# Patient Record
Sex: Female | Born: 1980 | Hispanic: Yes | Marital: Single | Smoking: Never smoker
Health system: Southern US, Community
[De-identification: ages and names within clinical notes are randomized; demographics above are authoritative.]

## PROBLEM LIST (undated history)

## (undated) ENCOUNTER — Inpatient Hospital Stay (HOSPITAL_COMMUNITY): Payer: Self-pay

## (undated) DIAGNOSIS — O09299 Supervision of pregnancy with other poor reproductive or obstetric history, unspecified trimester: Secondary | ICD-10-CM

## (undated) DIAGNOSIS — Z789 Other specified health status: Secondary | ICD-10-CM

## (undated) HISTORY — DX: Supervision of pregnancy with other poor reproductive or obstetric history, unspecified trimester: O09.299

## (undated) HISTORY — PX: NO PAST SURGERIES: SHX2092

## (undated) HISTORY — DX: Other specified health status: Z78.9

---
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
## 2000-01-01 ENCOUNTER — Other Ambulatory Visit: Admission: RE | Admit: 2000-01-01 | Discharge: 2000-01-01 | Payer: Self-pay | Admitting: Obstetrics

## 2000-01-01 ENCOUNTER — Encounter (INDEPENDENT_AMBULATORY_CARE_PROVIDER_SITE_OTHER): Payer: Self-pay

## 2002-03-12 ENCOUNTER — Ambulatory Visit (HOSPITAL_COMMUNITY): Admission: AD | Admit: 2002-03-12 | Discharge: 2002-03-12 | Payer: Self-pay | Admitting: *Deleted

## 2002-07-31 ENCOUNTER — Encounter: Admission: RE | Admit: 2002-07-31 | Discharge: 2002-07-31 | Payer: Self-pay | Admitting: *Deleted

## 2002-08-05 ENCOUNTER — Inpatient Hospital Stay (HOSPITAL_COMMUNITY): Admission: AD | Admit: 2002-08-05 | Discharge: 2002-08-07 | Payer: Self-pay | Admitting: *Deleted

## 2004-10-12 ENCOUNTER — Emergency Department (HOSPITAL_COMMUNITY): Admission: EM | Admit: 2004-10-12 | Discharge: 2004-10-12 | Payer: Self-pay | Admitting: Emergency Medicine

## 2004-12-19 ENCOUNTER — Ambulatory Visit (HOSPITAL_COMMUNITY): Admission: RE | Admit: 2004-12-19 | Discharge: 2004-12-19 | Payer: Self-pay | Admitting: *Deleted

## 2004-12-19 IMAGING — US US OB COMP +14 WK
1 series · 13 of 28 positions shown · non-contrast
Comparison: none

CLINICAL DATA: Anatomy scan; estimated gestational age by LMP is 17 weeks 6 days.

[Series 1: us ob comp +14 wk · 0.29mm/px · 13 of 64 slices shown]
[im 3/64]
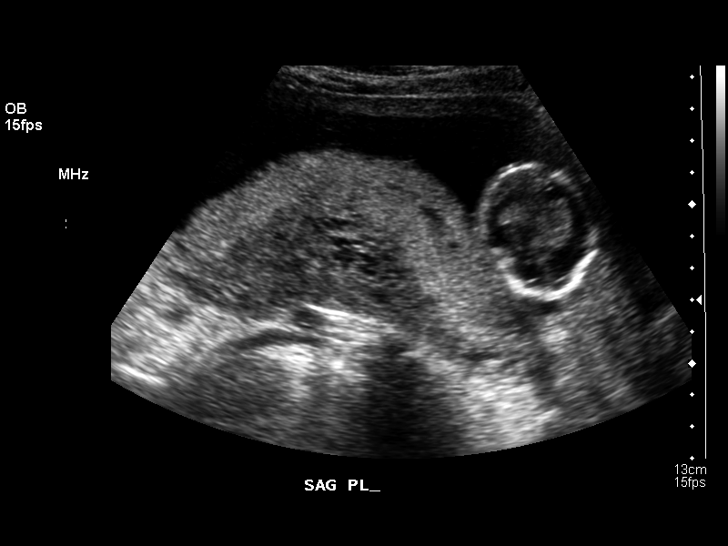
[im 8/64]
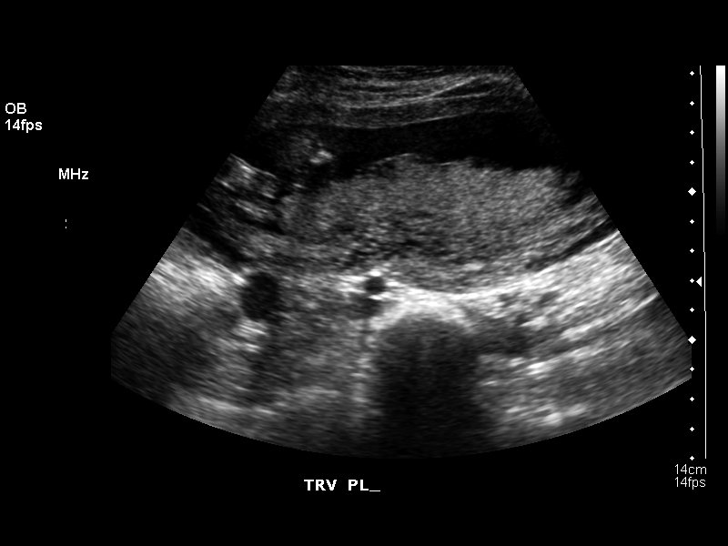
[im 12/64]
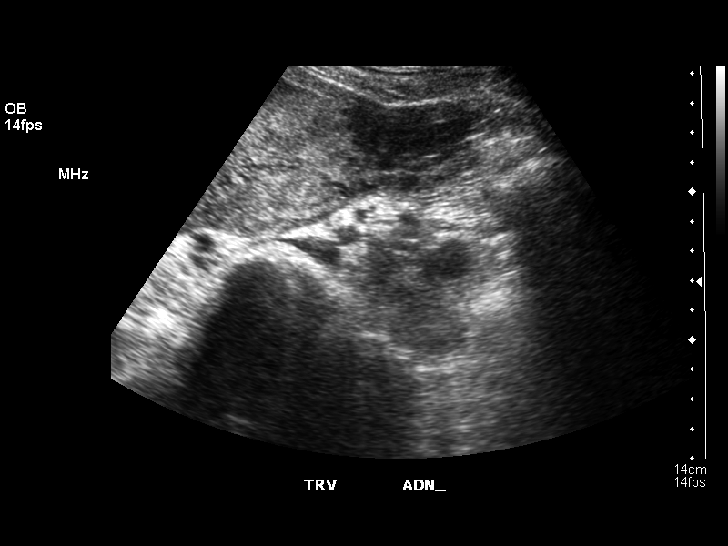
[im 17/64]
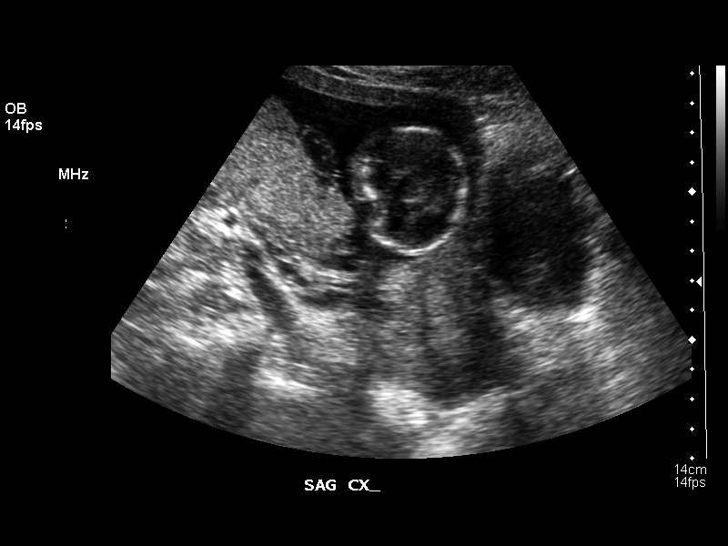
[im 22/64]
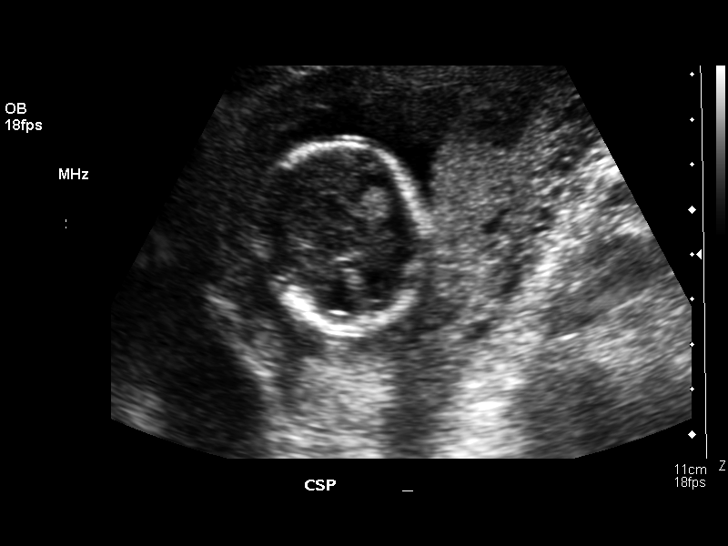
[im 26/64]
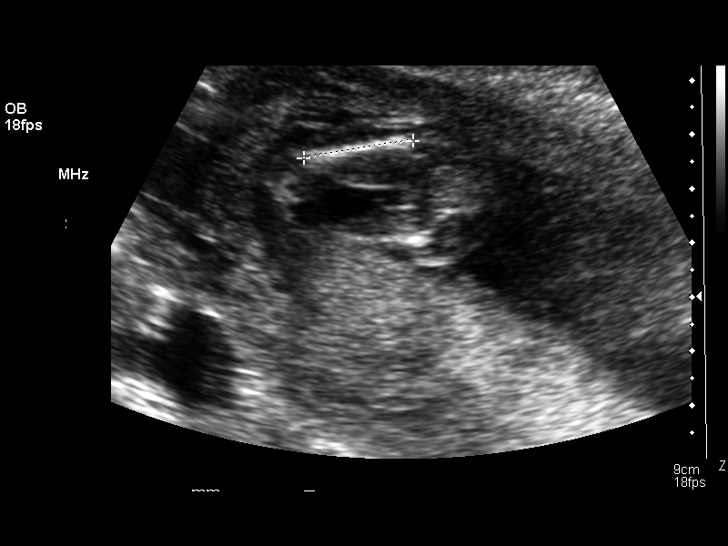
[im 33/64]
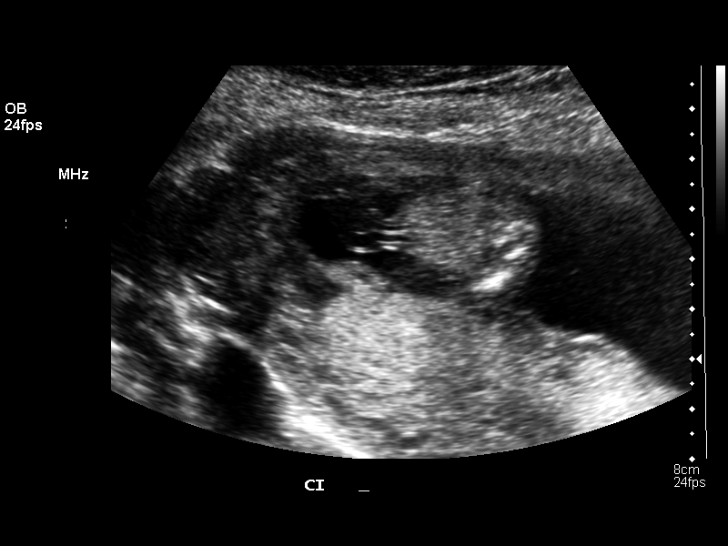
[im 38/64]
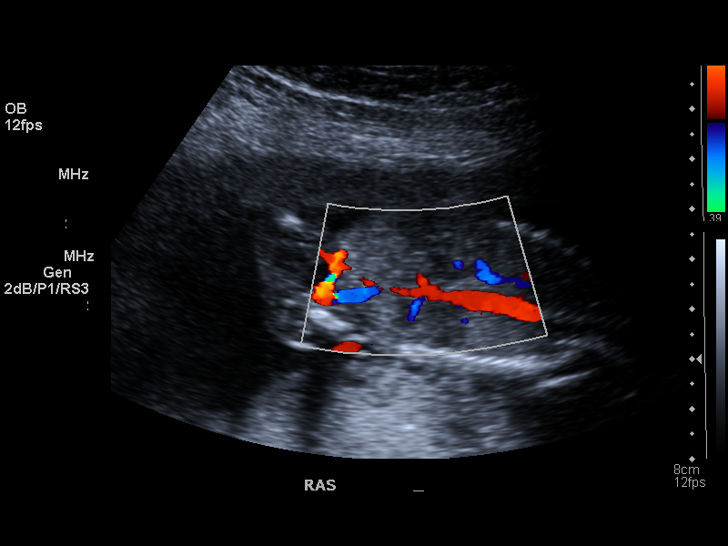
[im 43/64]
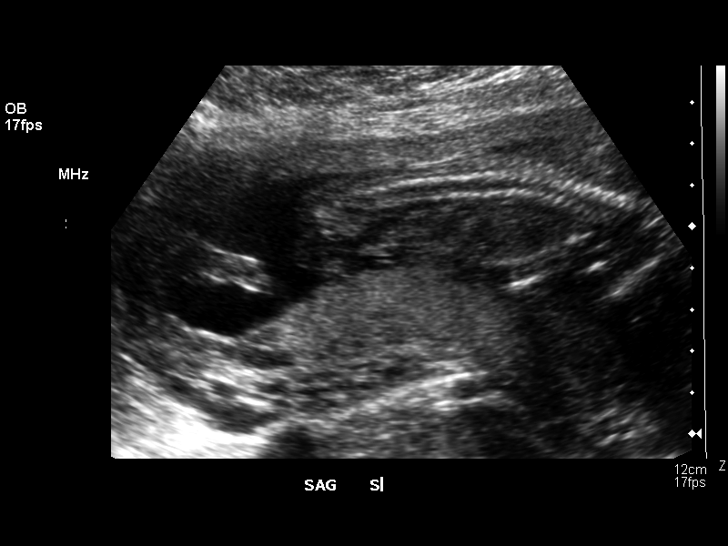
[im 47/64]
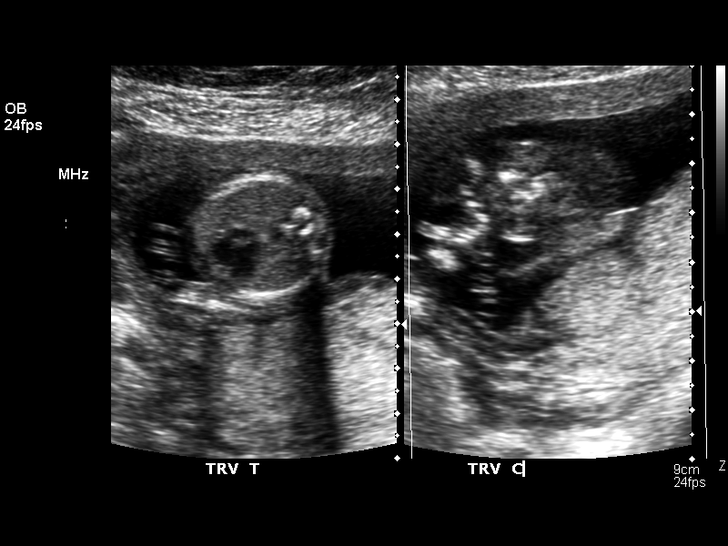
[im 52/64]
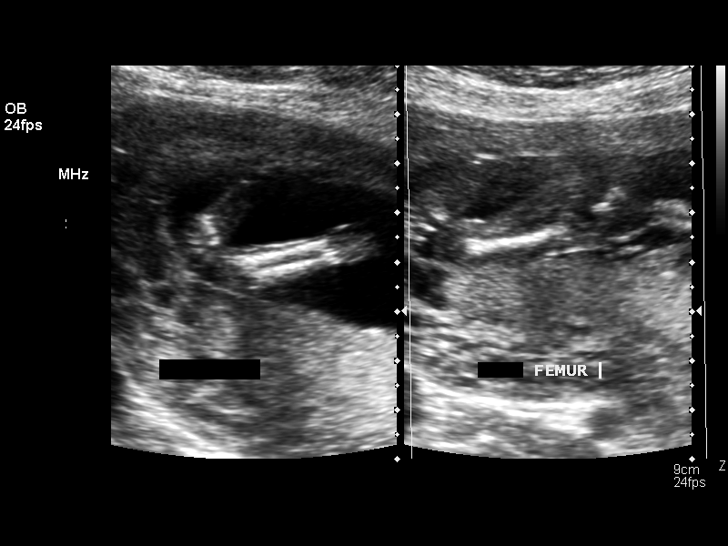
[im 57/64]
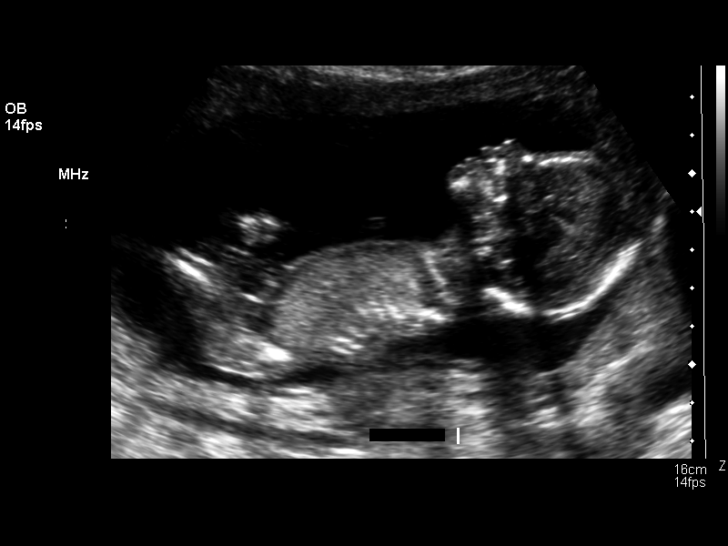
[im 61/64]
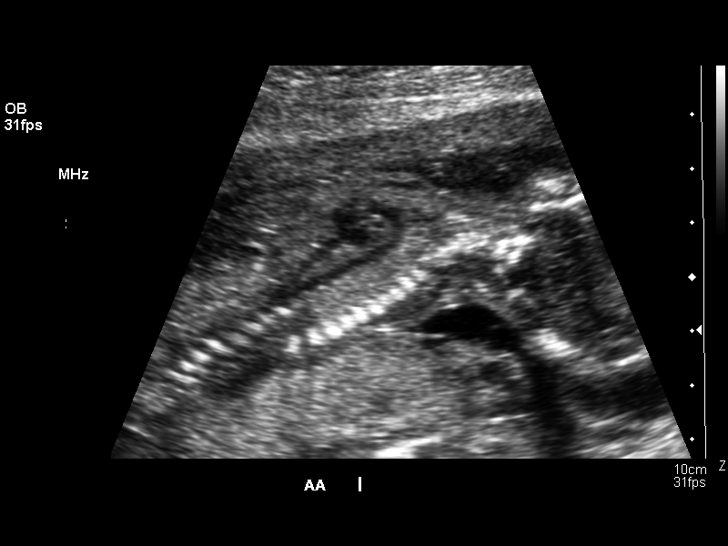

[13 of 28 positions shown; findings below may reference images not displayed]

OBSTETRICAL ULTRASOUND:
 Number of Fetuses:  1
 Heart Rate:  144
 Movement:  Yes
 Breathing:  No  
 Presentation:  Cephalic
 Placental Location:  Posterior
 Grade:  0
 Previa:  No
 Amniotic Fluid (Subjective):  Normal
 Amniotic Fluid (Objective):   3.4 cm Vertical pocket 

 FETAL BIOMETRY
 BPD:   3.2 cm   16 w 0 d
 HC:   12.9 cm   16 w 4 d
 AC:   10.4 cm   16 w 3 d
 FL:    2.1 cm  16 w 1 d

 MEAN GA:  16 w 2 d

 FETAL ANATOMY
 Lateral Ventricles:    Visualized 
 Thalami/CSP:      Visualized 
 Posterior Fossa:  Visualized 
 Nuchal Region:    N/A
 Spine:      Visualized 
 4 Chamber Heart on Left:      Visualized 
 Stomach on Left:      Visualized 
 3 Vessel Cord:    Visualized 
 Cord Insertion site:    Visualized 
 Kidneys:  Visualized 
 Bladder:  Visualized 
 Extremities:      Visualized 

 ADDITIONAL ANATOMY VISUALIZED:  LVOT, orbits, profile, diaphragm, heel, 5th digit, aortic arch, and male genitalia.

 Evaluation limited by:  Early gestational age.

 MATERNAL UTERINE AND ADNEXAL FINDINGS
 Cervix:   3.7 cm Transabdominally.  Normal ovaries.
IMPRESSION: There is a single living intrauterine gestation with a mean gestational age by today's ultrasound of 16 weeks 2 days which is 1 ? weeks younger than estimated by LMP.  The fetal indices are concordant and the visualized anatomy is normal.  Please note that the anatomy scan is limited by the early gestational age and follow-up for more detailed cardiac anatomy in 3 ? 4 weeks is recommended.

## 2004-12-30 ENCOUNTER — Inpatient Hospital Stay (HOSPITAL_COMMUNITY): Admission: AD | Admit: 2004-12-30 | Discharge: 2004-12-30 | Payer: Self-pay | Admitting: Obstetrics and Gynecology

## 2005-01-02 ENCOUNTER — Ambulatory Visit (HOSPITAL_COMMUNITY): Admission: RE | Admit: 2005-01-02 | Discharge: 2005-01-02 | Payer: Self-pay | Admitting: *Deleted

## 2005-01-17 ENCOUNTER — Inpatient Hospital Stay (HOSPITAL_COMMUNITY): Admission: AD | Admit: 2005-01-17 | Discharge: 2005-01-17 | Payer: Self-pay | Admitting: Obstetrics and Gynecology

## 2005-05-25 ENCOUNTER — Ambulatory Visit: Payer: Self-pay | Admitting: *Deleted

## 2005-06-01 ENCOUNTER — Ambulatory Visit: Payer: Self-pay | Admitting: *Deleted

## 2005-06-02 ENCOUNTER — Ambulatory Visit: Payer: Self-pay | Admitting: Certified Nurse Midwife

## 2005-06-02 ENCOUNTER — Inpatient Hospital Stay (HOSPITAL_COMMUNITY): Admission: AD | Admit: 2005-06-02 | Discharge: 2005-06-05 | Payer: Self-pay | Admitting: Obstetrics and Gynecology

## 2007-02-22 ENCOUNTER — Emergency Department (HOSPITAL_COMMUNITY): Admission: EM | Admit: 2007-02-22 | Discharge: 2007-02-23 | Payer: Self-pay | Admitting: Emergency Medicine

## 2009-04-22 ENCOUNTER — Emergency Department (HOSPITAL_COMMUNITY): Admission: EM | Admit: 2009-04-22 | Discharge: 2009-04-23 | Payer: Self-pay | Admitting: Emergency Medicine

## 2010-06-12 LAB — URINALYSIS, ROUTINE W REFLEX MICROSCOPIC
Bilirubin Urine: NEGATIVE
Glucose, UA: NEGATIVE mg/dL
Ketones, ur: NEGATIVE mg/dL
Nitrite: NEGATIVE
Protein, ur: NEGATIVE mg/dL
Specific Gravity, Urine: 1.016 (ref 1.005–1.030)
Urobilinogen, UA: 1 mg/dL (ref 0.0–1.0)
pH: 7 (ref 5.0–8.0)

## 2010-06-12 LAB — DIFFERENTIAL
Basophils Absolute: 0 10*3/uL (ref 0.0–0.1)
Basophils Relative: 0 % (ref 0–1)
Lymphocytes Relative: 17 % (ref 12–46)
Monocytes Absolute: 1 10*3/uL (ref 0.1–1.0)
Neutro Abs: 7.6 10*3/uL (ref 1.7–7.7)
Neutrophils Relative %: 72 % (ref 43–77)

## 2010-06-12 LAB — GC/CHLAMYDIA PROBE AMP, GENITAL
Chlamydia, DNA Probe: POSITIVE — AB
GC Probe Amp, Genital: NEGATIVE

## 2010-06-12 LAB — POCT I-STAT, CHEM 8
BUN: 11 mg/dL (ref 6–23)
Calcium, Ion: 1.21 mmol/L (ref 1.12–1.32)
Chloride: 107 mEq/L (ref 96–112)
HCT: 40 % (ref 36.0–46.0)
Potassium: 3.6 mEq/L (ref 3.5–5.1)
Sodium: 138 mEq/L (ref 135–145)

## 2010-06-12 LAB — WET PREP, GENITAL
Trich, Wet Prep: NONE SEEN
WBC, Wet Prep HPF POC: NONE SEEN
Yeast Wet Prep HPF POC: NONE SEEN

## 2010-06-12 LAB — CBC
Hemoglobin: 13.5 g/dL (ref 12.0–15.0)
MCHC: 34 g/dL (ref 30.0–36.0)
Platelets: 213 10*3/uL (ref 150–400)
RDW: 12.8 % (ref 11.5–15.5)

## 2010-06-12 LAB — URINE MICROSCOPIC-ADD ON

## 2011-02-20 LAB — OB RESULTS CONSOLE RUBELLA ANTIBODY, IGM: Rubella: UNDETERMINED

## 2011-02-20 LAB — OB RESULTS CONSOLE ABO/RH: RH Type: NEGATIVE

## 2011-02-20 LAB — OB RESULTS CONSOLE RPR: RPR: NONREACTIVE

## 2011-02-20 LAB — OB RESULTS CONSOLE HIV ANTIBODY (ROUTINE TESTING): HIV: NONREACTIVE

## 2011-03-27 NOTE — L&D Delivery Note (Signed)
Delivery Note At 3:31 PM a viable female was delivered via  (Presentation: ;  ).  APGAR: , ; weight .   Placenta status: , .  Cord:  with the following complications: .  Cord pH: not done  Anesthesia: Epidural  Episiotomy:  Lacerations:  Suture Repair: 2.0 Est. Blood Loss (mL):   Mom to postpartum.  Baby to nursery-stable.  Haadi Santellan A 08/14/2011, 3:40 PM

## 2011-05-23 ENCOUNTER — Other Ambulatory Visit: Payer: Self-pay | Admitting: Obstetrics

## 2011-05-23 ENCOUNTER — Other Ambulatory Visit: Payer: Self-pay

## 2011-05-30 ENCOUNTER — Inpatient Hospital Stay (HOSPITAL_COMMUNITY)
Admission: AD | Admit: 2011-05-30 | Discharge: 2011-05-30 | Disposition: A | Discharge status: Home / Self Care | Payer: Medicaid Other | Source: Ambulatory Visit | Attending: Obstetrics | Admitting: Obstetrics

## 2011-05-30 DIAGNOSIS — Z298 Encounter for other specified prophylactic measures: Secondary | ICD-10-CM | POA: Insufficient documentation

## 2011-05-30 DIAGNOSIS — Z2989 Encounter for other specified prophylactic measures: Secondary | ICD-10-CM | POA: Insufficient documentation

## 2011-05-30 DIAGNOSIS — Z3480 Encounter for supervision of other normal pregnancy, unspecified trimester: Secondary | ICD-10-CM | POA: Insufficient documentation

## 2011-05-30 MED ORDER — RHO D IMMUNE GLOBULIN 1500 UNIT/2ML IJ SOLN
300.0000 ug | Freq: Once | INTRAMUSCULAR | Status: AC
  Administered 2011-05-30: 300 ug via INTRAMUSCULAR
  Filled 2011-05-30: qty 2

## 2011-05-30 NOTE — Plan of Care (Signed)
Rhophylac information sheet given to the patient. Explained the 1 1/2 hours required to process this order. Patient verbalized understanding. Patient will leave after blood is drawn and return for the injection. Explained to the patient not to remove the bracelet until injection is given.

## 2011-05-31 LAB — RH IG WORKUP (INCLUDES ABO/RH)
ABO/RH(D): B NEG
Gestational Age(Wks): 29

## 2011-07-25 ENCOUNTER — Other Ambulatory Visit (HOSPITAL_COMMUNITY): Payer: Self-pay | Admitting: Obstetrics

## 2011-07-25 DIAGNOSIS — IMO0002 Reserved for concepts with insufficient information to code with codable children: Secondary | ICD-10-CM

## 2011-08-01 ENCOUNTER — Ambulatory Visit (HOSPITAL_COMMUNITY)
Admission: RE | Admit: 2011-08-01 | Discharge: 2011-08-01 | Disposition: A | Discharge status: Home / Self Care | Payer: Medicaid Other | Source: Ambulatory Visit | Attending: Obstetrics | Admitting: Obstetrics

## 2011-08-01 VITALS — BP 115/71 | HR 93 | Wt 153.0 lb

## 2011-08-01 DIAGNOSIS — IMO0002 Reserved for concepts with insufficient information to code with codable children: Secondary | ICD-10-CM

## 2011-08-01 DIAGNOSIS — O365990 Maternal care for other known or suspected poor fetal growth, unspecified trimester, not applicable or unspecified: Secondary | ICD-10-CM | POA: Insufficient documentation

## 2011-08-01 DIAGNOSIS — Z3689 Encounter for other specified antenatal screening: Secondary | ICD-10-CM | POA: Insufficient documentation

## 2011-08-01 NOTE — Progress Notes (Signed)
Patient seen for fetal ultrasound.  See full report in [facility].  Single IUP at 38 5/7 weeks Normal antomic fetal survey; however limited due to late gestational age The overall fetal growth is appropriate (62nd percentile) although HC and femur lengths somewhat lagging Normal amniotic fluid volume  Recommend follow up as clinically indicated  Alpha Gula, MD

## 2011-08-02 ENCOUNTER — Encounter (HOSPITAL_COMMUNITY): Payer: Self-pay

## 2011-08-02 NOTE — Progress Notes (Signed)
Encounter addended by: Alessandra Bevels. Chase Picket, RN on: 08/02/2011  1:52 PM<BR>     Documentation filed: Episodes

## 2011-08-10 ENCOUNTER — Inpatient Hospital Stay (HOSPITAL_COMMUNITY): Admission: AD | Admit: 2011-08-10 | Payer: Medicaid Other | Source: Ambulatory Visit | Admitting: Obstetrics

## 2011-08-13 ENCOUNTER — Telehealth (HOSPITAL_COMMUNITY): Payer: Self-pay | Admitting: *Deleted

## 2011-08-13 ENCOUNTER — Encounter (HOSPITAL_COMMUNITY): Payer: Self-pay | Admitting: *Deleted

## 2011-08-13 NOTE — Telephone Encounter (Signed)
Preadmission screen  

## 2011-08-14 ENCOUNTER — Inpatient Hospital Stay (HOSPITAL_COMMUNITY): Payer: Medicaid Other | Admitting: Anesthesiology

## 2011-08-14 ENCOUNTER — Inpatient Hospital Stay (HOSPITAL_COMMUNITY)
Admission: RE | Admit: 2011-08-14 | Discharge: 2011-08-16 | DRG: 775 | Disposition: A | Discharge status: Home / Self Care | Payer: Medicaid Other | Source: Ambulatory Visit | Attending: Obstetrics | Admitting: Obstetrics

## 2011-08-14 ENCOUNTER — Encounter (HOSPITAL_COMMUNITY): Payer: Self-pay

## 2011-08-14 ENCOUNTER — Encounter (HOSPITAL_COMMUNITY): Payer: Self-pay | Admitting: Anesthesiology

## 2011-08-14 LAB — CBC
Hemoglobin: 11 g/dL — ABNORMAL LOW (ref 12.0–15.0)
MCH: 27.8 pg (ref 26.0–34.0)
MCV: 85.3 fL (ref 78.0–100.0)
RBC: 3.95 MIL/uL (ref 3.87–5.11)

## 2011-08-14 MED ORDER — DIPHENHYDRAMINE HCL 25 MG PO CAPS: 25.0000 mg | ORAL_CAPSULE | Freq: Four times a day (QID) | ORAL | Status: DC | PRN

## 2011-08-14 MED ORDER — PHENYLEPHRINE 40 MCG/ML (10ML) SYRINGE FOR IV PUSH (FOR BLOOD PRESSURE SUPPORT): 80.0000 ug | PREFILLED_SYRINGE | INTRAVENOUS | Status: DC | PRN

## 2011-08-14 MED ORDER — PHENYLEPHRINE 40 MCG/ML (10ML) SYRINGE FOR IV PUSH (FOR BLOOD PRESSURE SUPPORT)
80.0000 ug | PREFILLED_SYRINGE | INTRAVENOUS | Status: DC | PRN
  Filled 2011-08-14: qty 5

## 2011-08-14 MED ORDER — BENZOCAINE-MENTHOL 20-0.5 % EX AERO: 1.0000 "application " | INHALATION_SPRAY | CUTANEOUS | Status: DC | PRN

## 2011-08-14 MED ORDER — DIPHENHYDRAMINE HCL 50 MG/ML IJ SOLN: 12.5000 mg | INTRAMUSCULAR | Status: DC | PRN

## 2011-08-14 MED ORDER — ONDANSETRON HCL 4 MG/2ML IJ SOLN: 4.0000 mg | INTRAMUSCULAR | Status: DC | PRN

## 2011-08-14 MED ORDER — CITRIC ACID-SODIUM CITRATE 334-500 MG/5ML PO SOLN: 30.0000 mL | ORAL | Status: DC | PRN

## 2011-08-14 MED ORDER — FERROUS SULFATE 325 (65 FE) MG PO TABS
325.0000 mg | ORAL_TABLET | Freq: Two times a day (BID) | ORAL | Status: DC
  Administered 2011-08-15 – 2011-08-16 (×3): 325 mg via ORAL
  Filled 2011-08-14 (×3): qty 1

## 2011-08-14 MED ORDER — LACTATED RINGERS IV SOLN
500.0000 mL | Freq: Once | INTRAVENOUS | Status: AC
  Administered 2011-08-14: 500 mL via INTRAVENOUS

## 2011-08-14 MED ORDER — LIDOCAINE HCL (PF) 1 % IJ SOLN
30.0000 mL | INTRAMUSCULAR | Status: DC | PRN
  Filled 2011-08-14: qty 30

## 2011-08-14 MED ORDER — FLEET ENEMA 7-19 GM/118ML RE ENEM: 1.0000 | ENEMA | RECTAL | Status: DC | PRN

## 2011-08-14 MED ORDER — IBUPROFEN 600 MG PO TABS
600.0000 mg | ORAL_TABLET | Freq: Four times a day (QID) | ORAL | Status: DC | PRN
  Filled 2011-08-14 (×4): qty 1

## 2011-08-14 MED ORDER — SIMETHICONE 80 MG PO CHEW: 80.0000 mg | CHEWABLE_TABLET | ORAL | Status: DC | PRN

## 2011-08-14 MED ORDER — OXYCODONE-ACETAMINOPHEN 5-325 MG PO TABS
1.0000 | ORAL_TABLET | ORAL | Status: DC | PRN
  Administered 2011-08-15 – 2011-08-16 (×3): 1 via ORAL
  Filled 2011-08-14 (×2): qty 1

## 2011-08-14 MED ORDER — OXYTOCIN 10 UNIT/ML IJ SOLN
INTRAMUSCULAR | Status: AC
  Administered 2011-08-14: 20 [IU] via INTRAVENOUS
  Filled 2011-08-14: qty 2

## 2011-08-14 MED ORDER — FENTANYL 2.5 MCG/ML BUPIVACAINE 1/10 % EPIDURAL INFUSION (WH - ANES)
INTRAMUSCULAR | Status: DC | PRN
  Administered 2011-08-14: 14 mL/h via EPIDURAL

## 2011-08-14 MED ORDER — OXYTOCIN 20 UNITS IN LACTATED RINGERS INFUSION - SIMPLE
1.0000 m[IU]/min | INTRAVENOUS | Status: DC
  Administered 2011-08-14: 1 m[IU]/min via INTRAVENOUS
  Filled 2011-08-14: qty 1000

## 2011-08-14 MED ORDER — OXYTOCIN BOLUS FROM INFUSION
500.0000 mL | Freq: Once | INTRAVENOUS | Status: AC
  Administered 2011-08-14: 500 mL via INTRAVENOUS
  Filled 2011-08-14: qty 500

## 2011-08-14 MED ORDER — ZOLPIDEM TARTRATE 5 MG PO TABS: 5.0000 mg | ORAL_TABLET | Freq: Every evening | ORAL | Status: DC | PRN

## 2011-08-14 MED ORDER — EPHEDRINE 5 MG/ML INJ
10.0000 mg | INTRAVENOUS | Status: DC | PRN
  Filled 2011-08-14: qty 4

## 2011-08-14 MED ORDER — EPHEDRINE 5 MG/ML INJ: 10.0000 mg | INTRAVENOUS | Status: DC | PRN

## 2011-08-14 MED ORDER — FENTANYL 2.5 MCG/ML BUPIVACAINE 1/10 % EPIDURAL INFUSION (WH - ANES)
14.0000 mL/h | INTRAMUSCULAR | Status: DC
  Filled 2011-08-14: qty 60

## 2011-08-14 MED ORDER — LIDOCAINE HCL (PF) 1 % IJ SOLN
INTRAMUSCULAR | Status: DC | PRN
  Administered 2011-08-14 (×2): 8 mL

## 2011-08-14 MED ORDER — WITCH HAZEL-GLYCERIN EX PADS: 1.0000 "application " | MEDICATED_PAD | CUTANEOUS | Status: DC | PRN

## 2011-08-14 MED ORDER — METHYLERGONOVINE MALEATE 0.2 MG/ML IJ SOLN
INTRAMUSCULAR | Status: AC
  Administered 2011-08-14: 0.2 mg via INTRAMUSCULAR
  Filled 2011-08-14: qty 1

## 2011-08-14 MED ORDER — PRENATAL MULTIVITAMIN CH
1.0000 | ORAL_TABLET | Freq: Every day | ORAL | Status: DC
  Administered 2011-08-15 – 2011-08-16 (×2): 1 via ORAL
  Filled 2011-08-14 (×2): qty 1

## 2011-08-14 MED ORDER — LACTATED RINGERS IV SOLN
INTRAVENOUS | Status: DC
  Administered 2011-08-14: 08:00:00 via INTRAVENOUS

## 2011-08-14 MED ORDER — OXYTOCIN 20 UNITS IN LACTATED RINGERS INFUSION - SIMPLE: 125.0000 mL/h | Freq: Once | INTRAVENOUS | Status: DC

## 2011-08-14 MED ORDER — TERBUTALINE SULFATE 1 MG/ML IJ SOLN: 0.2500 mg | Freq: Once | INTRAMUSCULAR | Status: DC | PRN

## 2011-08-14 MED ORDER — ACETAMINOPHEN 325 MG PO TABS: 650.0000 mg | ORAL_TABLET | ORAL | Status: DC | PRN

## 2011-08-14 MED ORDER — SENNOSIDES-DOCUSATE SODIUM 8.6-50 MG PO TABS
2.0000 | ORAL_TABLET | Freq: Every day | ORAL | Status: DC
  Administered 2011-08-14 – 2011-08-15 (×2): 2 via ORAL

## 2011-08-14 MED ORDER — IBUPROFEN 600 MG PO TABS
600.0000 mg | ORAL_TABLET | Freq: Four times a day (QID) | ORAL | Status: DC
  Administered 2011-08-14 – 2011-08-16 (×7): 600 mg via ORAL
  Filled 2011-08-14 (×3): qty 1

## 2011-08-14 MED ORDER — LACTATED RINGERS IV SOLN: 500.0000 mL | INTRAVENOUS | Status: DC | PRN

## 2011-08-14 MED ORDER — ONDANSETRON HCL 4 MG PO TABS: 4.0000 mg | ORAL_TABLET | ORAL | Status: DC | PRN

## 2011-08-14 MED ORDER — TETANUS-DIPHTH-ACELL PERTUSSIS 5-2.5-18.5 LF-MCG/0.5 IM SUSP
0.5000 mL | Freq: Once | INTRAMUSCULAR | Status: AC
  Administered 2011-08-16: 0.5 mL via INTRAMUSCULAR
  Filled 2011-08-14: qty 0.5

## 2011-08-14 MED ORDER — DIBUCAINE 1 % RE OINT: 1.0000 "application " | TOPICAL_OINTMENT | RECTAL | Status: DC | PRN

## 2011-08-14 MED ORDER — LANOLIN HYDROUS EX OINT: TOPICAL_OINTMENT | CUTANEOUS | Status: DC | PRN

## 2011-08-14 MED ORDER — BUTORPHANOL TARTRATE 2 MG/ML IJ SOLN: 1.0000 mg | INTRAMUSCULAR | Status: DC | PRN

## 2011-08-14 MED ORDER — OXYCODONE-ACETAMINOPHEN 5-325 MG PO TABS
1.0000 | ORAL_TABLET | ORAL | Status: DC | PRN
  Filled 2011-08-14: qty 1

## 2011-08-14 MED ORDER — ONDANSETRON HCL 4 MG/2ML IJ SOLN: 4.0000 mg | Freq: Four times a day (QID) | INTRAMUSCULAR | Status: DC | PRN

## 2011-08-14 NOTE — H&P (Signed)
This is Dr. Francoise Ceo dictating the history and physical on  Renee Melton she's a 31 year old gravida 4 para 303 negative GBS at 41 weeks and 2 days EDC 08/12/2011 and he is in for induction cervix is now 2 cm 80% and vertex - amniotomy was performed the fluids clear she is on low-dose Pitocin Past medical history her last pregnancy she had a postpartum hemorrhage received 2 units of packed cells Past surgical history negative Social history negative Family history negative System review noncontributory Physical exam this is a well-developed female in and in labor HEENT negative Lungs clear to P&A Breasts negative Heart regular rhythm no murmurs no gallops Abdomen term estimated fetal weight 6 pounds Pelvic as described above Extremities  Negative and

## 2011-08-14 NOTE — Anesthesia Procedure Notes (Signed)
Epidural Patient location during procedure: OB Start time: 08/14/2011 1:19 PM End time: 08/14/2011 1:25 PM Reason for block: procedure for pain  Staffing Anesthesiologist: Sandrea Hughs Performed by: anesthesiologist   Preanesthetic Checklist Completed: patient identified, site marked, surgical consent, pre-op evaluation, timeout performed, IV checked, risks and benefits discussed and monitors and equipment checked  Epidural Patient position: sitting Prep: site prepped and draped and DuraPrep Patient monitoring: continuous pulse ox and blood pressure Approach: midline Injection technique: LOR air  Needle:  Needle type: Tuohy  Needle gauge: 17 G Needle length: 9 cm Needle insertion depth: 5 cm cm Catheter type: closed end flexible Catheter size: 19 Gauge Catheter at skin depth: 10 cm Test dose: negative and Other  Assessment Sensory level: T10 Events: blood not aspirated, injection not painful, no injection resistance, negative IV test and no paresthesia

## 2011-08-14 NOTE — Anesthesia Preprocedure Evaluation (Signed)
Anesthesia Evaluation  Patient identified by MRN, date of birth, ID band Patient awake    Reviewed: Allergy & Precautions, H&P , NPO status , Patient's Chart, lab work & pertinent test results  Airway Mallampati: I TM Distance: >3 FB Neck ROM: full    Dental No notable dental hx.    Pulmonary    Pulmonary exam normal       Cardiovascular negative cardio ROS      Neuro/Psych negative neurological ROS  negative psych ROS   GI/Hepatic negative GI ROS, Neg liver ROS,   Endo/Other  negative endocrine ROS  Renal/GU negative Renal ROS  negative genitourinary   Musculoskeletal negative musculoskeletal ROS (+)   Abdominal Normal abdominal exam  (+)   Peds negative pediatric ROS (+)  Hematology negative hematology ROS (+)   Anesthesia Other Findings   Reproductive/Obstetrics (+) Pregnancy                           Anesthesia Physical Anesthesia Plan  ASA: II  Anesthesia Plan: Epidural   Post-op Pain Management:    Induction:   Airway Management Planned:   Additional Equipment:   Intra-op Plan:   Post-operative Plan:   Informed Consent: I have reviewed the patients History and Physical, chart, labs and discussed the procedure including the risks, benefits and alternatives for the proposed anesthesia with the patient or authorized representative who has indicated his/her understanding and acceptance.     Plan Discussed with:   Anesthesia Plan Comments:         Anesthesia Quick Evaluation  

## 2011-08-15 LAB — CBC
MCV: 85 fL (ref 78.0–100.0)
Platelets: 173 10*3/uL (ref 150–400)
RBC: 3.47 MIL/uL — ABNORMAL LOW (ref 3.87–5.11)
RDW: 15.7 % — ABNORMAL HIGH (ref 11.5–15.5)
WBC: 11.3 10*3/uL — ABNORMAL HIGH (ref 4.0–10.5)

## 2011-08-15 NOTE — Addendum Note (Signed)
Addendum  created 08/15/11 1409 by Suella Grove, CRNA   Modules edited:Charges VN, Notes Section

## 2011-08-15 NOTE — Anesthesia Postprocedure Evaluation (Signed)
Anesthesia Post Note  Patient: Renee Melton  Procedure(s) Performed: * No procedures listed *  Anesthesia type: Epidural  Patient location: Mother/Baby  Post pain: Pain level controlled  Post assessment: Post-op Vital signs reviewed  Last Vitals:  Filed Vitals:   08/15/11 0545  BP: 95/61  Pulse: 77  Temp: 36.8 C  Resp: 18    Post vital signs: Reviewed  Level of consciousness: awake  Complications: No apparent anesthesia complications

## 2011-08-15 NOTE — Anesthesia Postprocedure Evaluation (Signed)
  Anesthesia Post-op Note  Patient: Renee Melton  Procedure(s) Performed: * No procedures listed *  Patient Location: Mother/Baby  Anesthesia Type: Epidural  Level of Consciousness: awake, alert  and oriented  Airway and Oxygen Therapy: Patient Spontanous Breathing  Post-op Pain: mild  Post-op Assessment: Patient's Cardiovascular Status Stable, Respiratory Function Stable, Patent Airway, No signs of Nausea or vomiting, Adequate PO intake, Pain level controlled, No headache, No backache, No residual numbness and No residual motor weakness  Post-op Vital Signs: Reviewed and stable  Complications: No apparent anesthesia complications

## 2011-08-15 NOTE — Progress Notes (Signed)
Patient ID: Renee Melton, female   DOB: 06-06-1980, 30 y.o.   MRN: [id number] Postpartum day one Vital signs normal Fundus firm Lochia moderate Legs negative no complaints

## 2011-08-15 NOTE — Progress Notes (Signed)
UR chart review completed.  

## 2011-08-16 MED ORDER — MEASLES, MUMPS & RUBELLA VAC ~~LOC~~ INJ
0.5000 mL | INJECTION | Freq: Once | SUBCUTANEOUS | Status: AC
  Administered 2011-08-16: 0.5 mL via SUBCUTANEOUS
  Filled 2011-08-16 (×2): qty 0.5

## 2011-08-16 NOTE — Progress Notes (Signed)
MD gave patient prescription directly. RN gave patient discharge instructions, patient verbalized understanding. Patient walked off the unit with RN. Baby was taken to nursery and hugs tag removed. Mom and baby bracelets matched at discharge. Patient showed proper demonstration of car seat placement. Patient and baby left campus in a private vehicle.

## 2011-08-16 NOTE — Discharge Instructions (Signed)
Discharge instructions   You can wash your hair  Shower  Eat what you want  Drink what you want  See me in 6 weeks  Your ankles are going to swell more in the next 2 weeks than when pregnant  No sex for 6 weeks   Sunaina Ferrando A, MD 08/16/2011    

## 2011-08-16 NOTE — Discharge Summary (Signed)
Obstetric Discharge Summary Reason for Admission: induction of labor Prenatal Procedures: none Intrapartum Procedures: spontaneous vaginal delivery Postpartum Procedures: none Complications-Operative and Postpartum: none Hemoglobin  Date Value Range Status  08/15/2011 9.6* 12.0-15.0 (g/dL) Final     HCT  Date Value Range Status  08/15/2011 29.5* 36.0-46.0 (%) Final    Physical Exam:  General: alert Lochia: appropriate Uterine Fundus: firm Incision: healing well DVT Evaluation: No evidence of DVT seen on physical exam.  Discharge Diagnoses: Term Pregnancy-delivered  Discharge Information: Date: 08/16/2011 Activity: pelvic rest Diet: routine Medications: Percocet Condition: stable Instructions: refer to practice specific booklet Discharge to: home Follow-up Information    Follow up with Kenai Fluegel A, MD. Call in 6 weeks.   Contact information:   [address] [place] [address] [phone]          Newborn Data: Live born female  Birth Weight: 8 lb 2.9 oz (3710 g) APGAR: 9, 9  Home with mother.  Kentaro Alewine A 08/16/2011, 7:30 AM

## 2012-04-17 ENCOUNTER — Ambulatory Visit: Payer: Self-pay | Admitting: Physician Assistant

## 2012-04-17 VITALS — BP 111/71 | HR 86 | Temp 98.7°F | Resp 18 | Ht 63.0 in | Wt 131.0 lb

## 2012-04-17 DIAGNOSIS — N390 Urinary tract infection, site not specified: Secondary | ICD-10-CM

## 2012-04-17 DIAGNOSIS — R350 Frequency of micturition: Secondary | ICD-10-CM

## 2012-04-17 LAB — POCT URINALYSIS DIPSTICK
Bilirubin, UA: NEGATIVE
Glucose, UA: NEGATIVE
Ketones, UA: NEGATIVE
Nitrite, UA: NEGATIVE
Protein, UA: 30
Spec Grav, UA: <=1.005
Urobilinogen, UA: 0.2
pH, UA: 5.5

## 2012-04-17 LAB — POCT UA - MICROSCOPIC ONLY
Casts, Ur, LPF, POC: NEGATIVE
Crystals, Ur, HPF, POC: NEGATIVE
Mucus, UA: NEGATIVE
Yeast, UA: NEGATIVE

## 2012-04-17 MED ORDER — PHENAZOPYRIDINE HCL 200 MG PO TABS: 200.0000 mg | ORAL_TABLET | Freq: Three times a day (TID) | ORAL | Status: DC | PRN

## 2012-04-17 MED ORDER — NITROFURANTOIN MONOHYD MACRO 100 MG PO CAPS: 100.0000 mg | ORAL_CAPSULE | Freq: Two times a day (BID) | ORAL | Status: DC

## 2012-04-17 NOTE — Progress Notes (Signed)
Subjective:    Patient ID: Renee Melton, female    DOB: March 19, 1981, 32 y.o.   MRN: [id number]  HPI   [name] is a 32 yr old female here with concern for bladder infection.  Woke up around 3am this morning with urgency to urinate.  Was able to void but only a small amount of urine.  Continued to have urgency and frequency all morning.  Small amount of hematuria once.  Also experiencing dysuria.  Has never had a UTI before.  Denies abd pain but states it feels kind of "full".  Denies back pain, nausea, vomiting.  No vaginal discharge or concern for STI.     Review of Systems  Constitutional: Positive for chills. Negative for fever.  Respiratory: Negative.   Cardiovascular: Negative.   Gastrointestinal: Positive for abdominal pain ("fullness"). Negative for nausea and vomiting.  Genitourinary: Positive for dysuria, urgency, frequency and hematuria. Negative for vaginal discharge and vaginal pain.  Musculoskeletal: Negative.   Skin: Negative.   Neurological: Positive for headaches.       Objective:   Physical Exam  Vitals reviewed. Constitutional: She is oriented to person, place, and time. She appears well-developed and well-nourished. No distress.  HENT:  Head: Normocephalic and atraumatic.  Eyes: Conjunctivae normal are normal. No scleral icterus.  Cardiovascular: Normal rate, regular rhythm, normal heart sounds and intact distal pulses.  Exam reveals no gallop and no friction rub.   No murmur heard. Pulmonary/Chest: Effort normal and breath sounds normal. She has no wheezes. She has no rales.  Abdominal: Soft. Normal appearance and bowel sounds are normal. There is tenderness in the suprapubic area. There is no rigidity, no rebound, no guarding and no CVA tenderness.  Neurological: She is alert and oriented to person, place, and time.  Skin: Skin is warm and dry.  Psychiatric: She has a normal mood and affect. Her behavior is normal.     Filed Vitals:   04/17/12 1228  BP:  111/71  Pulse: 86  Temp: 98.7 F (37.1 C)  Resp: 18      Results for orders placed in visit on 04/17/12  POCT UA - MICROSCOPIC ONLY      Component Value Range   WBC, Ur, HPF, POC 8-12     RBC, urine, microscopic 10-15     Bacteria, U Microscopic trace     Mucus, UA neg     Epithelial cells, urine per micros 0-1     Crystals, Ur, HPF, POC neg     Casts, Ur, LPF, POC neg     Yeast, UA neg    POCT URINALYSIS DIPSTICK      Component Value Range   Color, UA yellow     Clarity, UA clear     Glucose, UA neg     Bilirubin, UA neg     Ketones, UA neg     Spec Grav, UA <=1.005     Blood, UA large     pH, UA 5.5     Protein, UA 30     Urobilinogen, UA 0.2     Nitrite, UA neg     Leukocytes, UA small (1+)          Assessment & Plan:   1. UTI (lower urinary tract infection)  phenazopyridine (PYRIDIUM) 200 MG tablet, Urine culture, nitrofurantoin, macrocrystal-monohydrate, (MACROBID) 100 MG capsule, DISCONTINUED: nitrofurantoin, macrocrystal-monohydrate, (MACROBID) 100 MG capsule  2. Urinary frequency  POCT UA - Microscopic Only, POCT urinalysis dipstick, Urine culture  [name] is a very pleasant 32 yr old female with several hours of UTI symptoms.  UA is not overly impressive, but symptoms started less than 12 hours ago.  Given frequency, urgency, dysuria, and hematuria with the presence of pyuria, will treat empirically as UTI with macrobid.  Pyridium if needed for symptoms over next 1-2 days.  Push fluids.  Culture sent, will make changes if necessary.  Discussed RTC precautions.  Pt will let us know if not improving in the next 2-3 days.

## 2012-04-17 NOTE — Patient Instructions (Addendum)
Begin taking the antibiotic as directed.  Be sure to finish the full course.  You may also use the pyridium for symptoms today and tomorrow.  Plenty of fluids (water is best!)  Please let us know if you are not improving in the next 2-3 days.  I will let you know when your urine culture comes back, and we can make changes if necessary.    Urinary Tract Infection Urinary tract infections (UTIs) can develop anywhere along your urinary tract. Your urinary tract is your body's drainage system for removing wastes and extra water. Your urinary tract includes two kidneys, two ureters, a bladder, and a urethra. Your kidneys are a pair of bean-shaped organs. Each kidney is about the size of your fist. They are located below your ribs, one on each side of your spine. CAUSES Infections are caused by microbes, which are microscopic organisms, including fungi, viruses, and bacteria. These organisms are so small that they can only be seen through a microscope. Bacteria are the microbes that most commonly cause UTIs. SYMPTOMS  Symptoms of UTIs may vary by age and gender of the patient and by the location of the infection. Symptoms in young women typically include a frequent and intense urge to urinate and a painful, burning feeling in the bladder or urethra during urination. Older women and men are more likely to be tired, shaky, and weak and have muscle aches and abdominal pain. A fever may mean the infection is in your kidneys. Other symptoms of a kidney infection include pain in your back or sides below the ribs, nausea, and vomiting. DIAGNOSIS To diagnose a UTI, your caregiver will ask you about your symptoms. Your caregiver also will ask to provide a urine sample. The urine sample will be tested for bacteria and white blood cells. White blood cells are made by your body to help fight infection. TREATMENT  Typically, UTIs can be treated with medication. Because most UTIs are caused by a bacterial infection, they  usually can be treated with the use of antibiotics. The choice of antibiotic and length of treatment depend on your symptoms and the type of bacteria causing your infection. HOME CARE INSTRUCTIONS  If you were prescribed antibiotics, take them exactly as your caregiver instructs you. Finish the medication even if you feel better after you have only taken some of the medication.  Drink enough water and fluids to keep your urine clear or pale yellow.  Avoid caffeine, tea, and carbonated beverages. They tend to irritate your bladder.  Empty your bladder often. Avoid holding urine for long periods of time.  Empty your bladder before and after sexual intercourse.  After a bowel movement, women should cleanse from front to back. Use each tissue only once. SEEK MEDICAL CARE IF:   You have back pain.  You develop a fever.  Your symptoms do not begin to resolve within 3 days. SEEK IMMEDIATE MEDICAL CARE IF:   You have severe back pain or lower abdominal pain.  You develop chills.  You have nausea or vomiting.  You have continued burning or discomfort with urination. MAKE SURE YOU:   Understand these instructions.  Will watch your condition.  Will get help right away if you are not doing well or get worse. Document Released: 12/20/2004 Document Revised: 09/11/2011 Document Reviewed: 04/20/2011 [facility] Patient Information 2013 [place], [place].

## 2012-04-19 LAB — URINE CULTURE: Colony Count: >=100000

## 2012-08-12 ENCOUNTER — Encounter (HOSPITAL_COMMUNITY): Payer: Self-pay | Admitting: Emergency Medicine

## 2012-08-12 ENCOUNTER — Emergency Department (HOSPITAL_COMMUNITY)
Admission: EM | Admit: 2012-08-12 | Discharge: 2012-08-12 | Disposition: A | Discharge status: Home / Self Care | Payer: Self-pay | Attending: Emergency Medicine | Admitting: Emergency Medicine

## 2012-08-12 DIAGNOSIS — K055 Other periodontal diseases: Secondary | ICD-10-CM | POA: Insufficient documentation

## 2012-08-12 DIAGNOSIS — Z3202 Encounter for pregnancy test, result negative: Secondary | ICD-10-CM | POA: Insufficient documentation

## 2012-08-12 DIAGNOSIS — R109 Unspecified abdominal pain: Secondary | ICD-10-CM | POA: Insufficient documentation

## 2012-08-12 DIAGNOSIS — IMO0002 Reserved for concepts with insufficient information to code with codable children: Secondary | ICD-10-CM | POA: Insufficient documentation

## 2012-08-12 DIAGNOSIS — N939 Abnormal uterine and vaginal bleeding, unspecified: Secondary | ICD-10-CM

## 2012-08-12 DIAGNOSIS — J45909 Unspecified asthma, uncomplicated: Secondary | ICD-10-CM | POA: Insufficient documentation

## 2012-08-12 DIAGNOSIS — Z8742 Personal history of other diseases of the female genital tract: Secondary | ICD-10-CM | POA: Insufficient documentation

## 2012-08-12 DIAGNOSIS — Z880 Allergy status to penicillin: Secondary | ICD-10-CM | POA: Insufficient documentation

## 2012-08-12 LAB — WET PREP, GENITAL
Trich, Wet Prep: NONE SEEN
Yeast Wet Prep HPF POC: NONE SEEN

## 2012-08-12 LAB — CBC WITH DIFFERENTIAL/PLATELET
HCT: 38.3 % (ref 36.0–46.0)
Hemoglobin: 13.6 g/dL (ref 12.0–15.0)
Lymphs Abs: 2.5 10*3/uL (ref 0.7–4.0)
MCH: 30.5 pg (ref 26.0–34.0)
Monocytes Relative: 10 % (ref 3–12)
Neutro Abs: 3.3 10*3/uL (ref 1.7–7.7)
Neutrophils Relative %: 49 % (ref 43–77)
RBC: 4.46 MIL/uL (ref 3.87–5.11)

## 2012-08-12 LAB — URINALYSIS, ROUTINE W REFLEX MICROSCOPIC
Bilirubin Urine: NEGATIVE
Glucose, UA: NEGATIVE mg/dL
Nitrite: NEGATIVE
Specific Gravity, Urine: 1.006 (ref 1.005–1.030)
pH: 7.5 (ref 5.0–8.0)

## 2012-08-12 LAB — COMPREHENSIVE METABOLIC PANEL
Alkaline Phosphatase: 78 U/L (ref 39–117)
BUN: 12 mg/dL (ref 6–23)
Chloride: 103 mEq/L (ref 96–112)
GFR calc Af Amer: >90 mL/min (ref >90)
GFR calc non Af Amer: >90 mL/min (ref >90)
Glucose, Bld: 97 mg/dL (ref 70–99)
Potassium: 3.6 mEq/L (ref 3.5–5.1)
Total Bilirubin: 0.4 mg/dL (ref 0.3–1.2)

## 2012-08-12 LAB — URINE MICROSCOPIC-ADD ON

## 2012-08-12 LAB — POCT PREGNANCY, URINE: Preg Test, Ur: NEGATIVE

## 2012-08-12 NOTE — ED Notes (Signed)
Patient given discharge instructions for vaginal bleeding. [facility] Patient voiced understanding of all instructions and had no further questions. Patient ambulated to front lobby without difficulty.

## 2012-08-12 NOTE — ED Notes (Signed)
Patient ambulating to restroom without difficulty at this time. 

## 2012-08-12 NOTE — ED Notes (Signed)
Assisted Dondra Spry, [place] in pelvic exam. Patient tolerated well.

## 2012-08-12 NOTE — ED Notes (Addendum)
PT. REPORTS VAGINAL SPOTTING THIS MORNING AND INTERMITTENT LOW ABDOMINAL CRAMPING , POSITIVE HOME PREGNANCY TEST LAST Saturday ( G4P3), LMP - 07/09/2012 .

## 2012-08-12 NOTE — ED Notes (Signed)
Pelvic cart set up in room at this time.

## 2012-08-12 NOTE — ED Provider Notes (Signed)
History     CSN: [id number]  Arrival date & time 08/12/12  1902   First MD Initiated Contact with Patient 08/12/12 2014      Chief Complaint  Patient presents with  . Vaginal Bleeding    (Consider location/radiation/quality/duration/timing/severity/associated sxs/prior treatment) HPI Comments: Patient is 4 days late for her normal menses, had 1 episode of unprotected intercourse this month did a home pregnancy test which was + 3 days ago than today started having vaginal bleeding that started as a normal period at first light than heavies ,having mild cramping. Denies any vaginal discharge prior to bleeding, no concern for STD, no Hx abnormal PAP smears   Patient is a 32 y.o. female presenting with vaginal bleeding. The history is provided by the patient.  Vaginal Bleeding Quality:  Typical of menses Severity:  Moderate Duration:  12 hours Timing:  Constant Progression:  Worsening Chronicity:  New Menstrual history:  Missed period Number of pads used:  3 Possible pregnancy: yes   Context: spontaneously   Relieved by:  Nothing Worsened by:  Nothing tried Associated symptoms: abdominal pain and dyspareunia   Associated symptoms: no back pain, no dizziness, no dysuria, no fatigue, no fever, no nausea and no vaginal discharge     Past Medical History  Diagnosis Date  . No pertinent past medical history   . History of postpartum hemorrhage, currently pregnant   . Asthma     albuterol inhaler 1-2 times a day    Past Surgical History  Procedure Laterality Date  . No past surgeries      Family History  Problem Relation Age of Onset  . Diabetes Mother   . Cancer Paternal Grandmother     breast  . Anesthesia problems Neg Hx   . Hypotension Neg Hx   . Malignant hyperthermia Neg Hx   . Pseudochol deficiency Neg Hx     History  Substance Use Topics  . Smoking status: Never Smoker   . Smokeless tobacco: Never Used  . Alcohol Use: No    OB History   Grav Para Term  Preterm Abortions TAB SAB Ect Mult Living   4 4 4       4       Review of Systems  Constitutional: Negative for fever and fatigue.  Gastrointestinal: Positive for abdominal pain. Negative for nausea.  Genitourinary: Positive for vaginal bleeding and dyspareunia. Negative for dysuria, vaginal discharge, vaginal pain, menstrual problem and pelvic pain.  Musculoskeletal: Negative for back pain.  Neurological: Negative for dizziness.  All other systems reviewed and are negative.    Allergies  Penicillins  Home Medications  No current outpatient prescriptions on file.  BP 136/89  Pulse 80  Temp(Src) 98.5 F (36.9 C) (Oral)  Resp 16  SpO2 100%  LMP 07/09/2012  Physical Exam  Nursing note and vitals reviewed. Constitutional: She is oriented to person, place, and time. She appears well-developed and well-nourished.  HENT:  Head: Normocephalic.  Eyes: Pupils are equal, round, and reactive to light.  Neck: Normal range of motion.  Cardiovascular: Normal rate.   Pulmonary/Chest: Effort normal.  Abdominal: Soft. Bowel sounds are normal. She exhibits no distension. There is no tenderness.  Genitourinary: Uterus normal. There is bleeding around the vagina. No tenderness around the vagina. No vaginal discharge found.  Musculoskeletal: Normal range of motion.  Neurological: She is alert and oriented to person, place, and time.  Skin: Skin is warm and dry. No rash noted. No pallor.    ED  Course  Procedures (including critical care time)  Labs Reviewed  WET PREP, GENITAL - Abnormal; Notable for the following:    Clue Cells Wet Prep HPF POC RARE (*)    All other components within normal limits  URINALYSIS, ROUTINE W REFLEX MICROSCOPIC - Abnormal; Notable for the following:    Color, Urine RED (*)    Hgb urine dipstick LARGE (*)    Protein, ur 30 (*)    Leukocytes, UA SMALL (*)    All other components within normal limits  GC/CHLAMYDIA PROBE AMP  CBC WITH DIFFERENTIAL   COMPREHENSIVE METABOLIC PANEL  URINE MICROSCOPIC-ADD ON  HCG, QUANTITATIVE, PREGNANCY  POCT PREGNANCY, URINE  POCT PREGNANCY, URINE   No results found.   1. Vaginal bleeding       MDM  Patient's quantitative beta is to discuss this with Dr. Freida Busman.  [facility] for repeat quantitative beta on pelvic precautions         Arman Filter, NP 08/15/12 2008

## 2012-08-13 LAB — GC/CHLAMYDIA PROBE AMP
CT Probe RNA: NEGATIVE
GC Probe RNA: NEGATIVE

## 2012-08-15 NOTE — ED Provider Notes (Signed)
Medical screening examination/treatment/procedure(s) were performed by non-physician practitioner and as supervising physician I was immediately available for consultation/collaboration.  Toy Baker, MD 08/15/12 [phone]

## 2013-01-07 LAB — OB RESULTS CONSOLE HEPATITIS B SURFACE ANTIGEN: Hepatitis B Surface Ag: NEGATIVE

## 2013-01-07 LAB — OB RESULTS CONSOLE RUBELLA ANTIBODY, IGM
RUBELLA: UNDETERMINED
RUBELLA: IMMUNE
RUBELLA: IMMUNE
Rubella: IMMUNE
Rubella: IMMUNE
Rubella: IMMUNE

## 2013-01-07 LAB — OB RESULTS CONSOLE RPR: RPR: NONREACTIVE

## 2013-01-07 LAB — OB RESULTS CONSOLE GC/CHLAMYDIA
Chlamydia: NEGATIVE
Gonorrhea: NEGATIVE

## 2013-01-07 LAB — OB RESULTS CONSOLE ANTIBODY SCREEN: Antibody Screen: NEGATIVE

## 2013-01-07 LAB — OB RESULTS CONSOLE HIV ANTIBODY (ROUTINE TESTING): HIV: NONREACTIVE

## 2013-01-07 LAB — OB RESULTS CONSOLE ABO/RH: RH TYPE: NEGATIVE

## 2013-03-26 NOTE — L&D Delivery Note (Signed)
Delivery Note Mother pushed very well x 20 min.  1 min shoulder dystocia relieved with suprapubic and McRoberts.  At 7:45 AM a viable and healthy female was delivered via Vaginal, Spontaneous Delivery (Presentation: Left Occiput Anterior).  APGAR: 8, 9; weight 10 lb 9.7 oz (4810 g).   Placenta status: Intact, Spontaneous.  Cord: 3 vessels with the following complications: None.  Anesthesia: Epidural  Episiotomy: None Lacerations: None Suture Repair: N/A Est. Blood Loss (mL): 400  Mom to postpartum.  Baby to Couplet care / Skin to Skin.  BOVARD,Christena Sunderlin 05/20/2013, 8:09 AM  Br/B neg/ Essure PP

## 2013-04-22 LAB — OB RESULTS CONSOLE GBS: GBS: NEGATIVE

## 2013-05-02 ENCOUNTER — Inpatient Hospital Stay (HOSPITAL_COMMUNITY)
Admission: AD | Admit: 2013-05-02 | Discharge: 2013-05-02 | Disposition: A | Discharge status: Home / Self Care | Payer: Medicaid Other | Source: Ambulatory Visit | Attending: Obstetrics and Gynecology | Admitting: Obstetrics and Gynecology

## 2013-05-02 ENCOUNTER — Encounter (HOSPITAL_COMMUNITY): Payer: Self-pay

## 2013-05-02 DIAGNOSIS — R109 Unspecified abdominal pain: Secondary | ICD-10-CM | POA: Insufficient documentation

## 2013-05-02 DIAGNOSIS — Y92410 Unspecified street and highway as the place of occurrence of the external cause: Secondary | ICD-10-CM | POA: Insufficient documentation

## 2013-05-02 DIAGNOSIS — O99891 Other specified diseases and conditions complicating pregnancy: Secondary | ICD-10-CM | POA: Insufficient documentation

## 2013-05-02 DIAGNOSIS — O479 False labor, unspecified: Secondary | ICD-10-CM | POA: Insufficient documentation

## 2013-05-02 DIAGNOSIS — O9989 Other specified diseases and conditions complicating pregnancy, childbirth and the puerperium: Principal | ICD-10-CM

## 2013-05-02 DIAGNOSIS — O9A213 Injury, poisoning and certain other consequences of external causes complicating pregnancy, third trimester: Secondary | ICD-10-CM

## 2013-05-02 LAB — CBC
HEMATOCRIT: 33.7 % — AB (ref 36.0–46.0)
HEMOGLOBIN: 11.3 g/dL — AB (ref 12.0–15.0)
MCH: 27.8 pg (ref 26.0–34.0)
MCHC: 33.5 g/dL (ref 30.0–36.0)
MCV: 83 fL (ref 78.0–100.0)
Platelets: 186 10*3/uL (ref 150–400)
RBC: 4.06 MIL/uL (ref 3.87–5.11)
RDW: 15.5 % (ref 11.5–15.5)
WBC: 9.6 10*3/uL (ref 4.0–10.5)

## 2013-05-02 LAB — KLEIHAUER-BETKE STAIN
# VIALS RHIG: 1
FETAL CELLS %: 0 %
QUANTITATION FETAL HEMOGLOBIN: 0 mL
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
## 2013-05-02 NOTE — Discharge Instructions (Signed)
Reasons to return to MAU:  1.  Contractions are  5 minutes apart or less, each last 1 minute, these have been going on for 1-2 hours, and you cannot walk or talk during them 2.  You have a large gush of fluid, or a trickle of fluid that will not stop and you have to wear a pad 3.  You have bleeding that is bright red, heavier than spotting--like menstrual bleeding (spotting can be normal in early labor or after a check of your cervix) 4.  You do not feel the baby moving like he/she normally does  Injuries In Pregnancy Trauma is the most common cause of injury and death in pregnant women. The most common cause of death to the fetus is injury and death of the pregnant mother.  Minor falls and minor automobile accidents do not usually harm the fetus. The fetus is protected in the womb by a sac filled with fluid. The fetus can be harmed if there is direct trauma to your abdomen and pelvis. Direct trauma to the uterus and placenta can affect the blood supply to the fetus. Major trauma causing significant bleeding and shock to the mother can also compromise and jeopardize the fetal blood supply. It is important to know your blood type and the father's blood type in case you develop vaginal bleeding. If you are RH negative and have sustained serious trauma or develop vaginal bleeding, you will need to have medicine (RhoGAM [Rh immune globulin]) to avoid Rh problems in future pregnancies. CAUSES  Falls are more common in the second and third trimester of the pregnancy. Factors that increase your risk of falling include:  Increase in weight.  The change of your center of gravity.  Tripping over an object that cannot be seen.  Automobile accidents. It is important to wear a seat belt and always practice safe driving.  Domestic violence or assault. Dial your local emergency services (911 in the [place]). Spousal abuse can be a significant cause of trauma during pregnancy.  Burns (Metallurgist). Avoid  fires, starting fires, lifting heavy pots of boiling or hot liquids, and fixing electrical problems. The most common causes of death to the pregnant woman include:  Injuries that cause severe bleeding, shock and loss of blood flow to the mother's major organs.  Head and neck injuries that result in severe brain or spinal damage.  Chest trauma that can cause direct injury to the heart and lungs or any injury that effects the area enclosed by the ribs (thorax). Trauma to this area can result in cardio-respiratory arrest. Symptoms and treatment will depend on the type of injury. HOME CARE INSTRUCTIONS   Call your caregiver if you are in a car accident, even if you think you and the baby are not hurt. Your caregiver may want you to have a precautionary evaluation.  Do not take aspirin. It can worsen bleeding.  You may apply cold packs 3 to 4 times a day to the injury with your caregiver's permission.  After 24 hours apply warm compresses to the injured site with your caregiver's permission.  Call your caregiver if you are having increasing pain in any part of your body that is not remedied by your instructed home care.  In a severe injury, try to have someone be with you and help you until you are able to take care of yourself.  Do not wear high heel shoes while pregnant.  Remove slippery rugs and loose objects on the floor.  SEEK IMMEDIATE MEDICAL CARE IF:   You have been assaulted (domestic or otherwise).  You have been in a car accident.  You develop vaginal bleeding.  You develop fluid leaking from the vagina.  You develop uterine contractions (pelvic cramping or pain).  You develop neck stiffness or pain.  You become weak or faint, or have uncontrolled vomiting after trauma.  You had a serious burn. This includes burns to the face, neck, hands or genitals, or burns greater than the size of your palm anywhere else.  You develop a headache or vision problems after a fall or  from other trauma.  You do not feel the baby moving or the baby is not moving as much as before. Document Released: 04/19/2004 Document Revised: 06/04/2011 Document Reviewed: 12/17/2012 [facility] Patient Information 2014 [place], [place].
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
## 2013-05-20 ENCOUNTER — Encounter (HOSPITAL_COMMUNITY): Payer: Self-pay | Admitting: *Deleted

## 2013-05-20 ENCOUNTER — Inpatient Hospital Stay (HOSPITAL_COMMUNITY)
Admission: AD | Admit: 2013-05-20 | Discharge: 2013-05-22 | DRG: 775 | Disposition: A | Discharge status: Home / Self Care | Payer: Medicaid Other | Source: Ambulatory Visit | Attending: Obstetrics and Gynecology | Admitting: Obstetrics and Gynecology

## 2013-05-20 ENCOUNTER — Encounter (HOSPITAL_COMMUNITY): Payer: Medicaid Other | Admitting: Anesthesiology

## 2013-05-20 ENCOUNTER — Inpatient Hospital Stay (HOSPITAL_COMMUNITY): Payer: Medicaid Other | Admitting: Anesthesiology

## 2013-05-20 DIAGNOSIS — J45909 Unspecified asthma, uncomplicated: Secondary | ICD-10-CM | POA: Diagnosis present

## 2013-05-20 DIAGNOSIS — O9989 Other specified diseases and conditions complicating pregnancy, childbirth and the puerperium: Secondary | ICD-10-CM

## 2013-05-20 DIAGNOSIS — Z880 Allergy status to penicillin: Secondary | ICD-10-CM

## 2013-05-20 DIAGNOSIS — O99892 Other specified diseases and conditions complicating childbirth: Secondary | ICD-10-CM | POA: Diagnosis present

## 2013-05-20 DIAGNOSIS — IMO0001 Reserved for inherently not codable concepts without codable children: Secondary | ICD-10-CM

## 2013-05-20 LAB — CBC
HCT: 33.7 % — ABNORMAL LOW (ref 36.0–46.0)
Hemoglobin: 11.5 g/dL — ABNORMAL LOW (ref 12.0–15.0)
MCH: 27.6 pg (ref 26.0–34.0)
MCHC: 34.1 g/dL (ref 30.0–36.0)
MCV: 81 fL (ref 78.0–100.0)
PLATELETS: 173 10*3/uL (ref 150–400)
RBC: 4.16 MIL/uL (ref 3.87–5.11)
RDW: 15.3 % (ref 11.5–15.5)
WBC: 9.3 10*3/uL (ref 4.0–10.5)

## 2013-05-20 LAB — RPR: RPR: NONREACTIVE

## 2013-05-20 MED ORDER — OXYTOCIN 40 UNITS IN LACTATED RINGERS INFUSION - SIMPLE MED
1.0000 m[IU]/min | INTRAVENOUS | Status: DC
  Administered 2013-05-20: 1 m[IU]/min via INTRAVENOUS

## 2013-05-20 MED ORDER — OXYCODONE-ACETAMINOPHEN 5-325 MG PO TABS
1.0000 | ORAL_TABLET | ORAL | Status: DC | PRN
  Administered 2013-05-20 – 2013-05-21 (×4): 1 via ORAL
  Filled 2013-05-20 (×4): qty 1

## 2013-05-20 MED ORDER — BENZOCAINE-MENTHOL 20-0.5 % EX AERO: 1.0000 "application " | INHALATION_SPRAY | CUTANEOUS | Status: DC | PRN

## 2013-05-20 MED ORDER — ONDANSETRON HCL 4 MG PO TABS: 4.0000 mg | ORAL_TABLET | ORAL | Status: DC | PRN

## 2013-05-20 MED ORDER — EPHEDRINE 5 MG/ML INJ
10.0000 mg | INTRAVENOUS | Status: DC | PRN
  Filled 2013-05-20: qty 2

## 2013-05-20 MED ORDER — LACTATED RINGERS IV SOLN: 500.0000 mL | INTRAVENOUS | Status: DC | PRN

## 2013-05-20 MED ORDER — LACTATED RINGERS IV SOLN: INTRAVENOUS | Status: DC

## 2013-05-20 MED ORDER — ACETAMINOPHEN 325 MG PO TABS: 650.0000 mg | ORAL_TABLET | ORAL | Status: DC | PRN

## 2013-05-20 MED ORDER — LANOLIN HYDROUS EX OINT: TOPICAL_OINTMENT | CUTANEOUS | Status: DC | PRN

## 2013-05-20 MED ORDER — EPHEDRINE 5 MG/ML INJ
10.0000 mg | INTRAVENOUS | Status: DC | PRN
  Filled 2013-05-20: qty 2
  Filled 2013-05-20: qty 4

## 2013-05-20 MED ORDER — FENTANYL 2.5 MCG/ML BUPIVACAINE 1/10 % EPIDURAL INFUSION (WH - ANES)
14.0000 mL/h | INTRAMUSCULAR | Status: DC | PRN
  Administered 2013-05-20: 14 mL/h via EPIDURAL
  Filled 2013-05-20: qty 125

## 2013-05-20 MED ORDER — ONDANSETRON HCL 4 MG/2ML IJ SOLN: 4.0000 mg | INTRAMUSCULAR | Status: DC | PRN

## 2013-05-20 MED ORDER — ZOLPIDEM TARTRATE 5 MG PO TABS: 5.0000 mg | ORAL_TABLET | Freq: Every evening | ORAL | Status: DC | PRN

## 2013-05-20 MED ORDER — OXYTOCIN 40 UNITS IN LACTATED RINGERS INFUSION - SIMPLE MED
62.5000 mL/h | INTRAVENOUS | Status: DC
  Administered 2013-05-20: 62.5 mL/h via INTRAVENOUS
  Filled 2013-05-20 (×2): qty 1000

## 2013-05-20 MED ORDER — SENNOSIDES-DOCUSATE SODIUM 8.6-50 MG PO TABS
2.0000 | ORAL_TABLET | ORAL | Status: DC
  Administered 2013-05-20 – 2013-05-22 (×2): 2 via ORAL
  Filled 2013-05-20 (×2): qty 2

## 2013-05-20 MED ORDER — PHENYLEPHRINE 40 MCG/ML (10ML) SYRINGE FOR IV PUSH (FOR BLOOD PRESSURE SUPPORT)
80.0000 ug | PREFILLED_SYRINGE | INTRAVENOUS | Status: DC | PRN
  Filled 2013-05-20: qty 10
  Filled 2013-05-20: qty 2

## 2013-05-20 MED ORDER — ONDANSETRON HCL 4 MG/2ML IJ SOLN: 4.0000 mg | Freq: Four times a day (QID) | INTRAMUSCULAR | Status: DC | PRN

## 2013-05-20 MED ORDER — OXYCODONE-ACETAMINOPHEN 5-325 MG PO TABS
1.0000 | ORAL_TABLET | ORAL | Status: DC | PRN
Start: 2013-05-20 — End: 2013-05-20

## 2013-05-20 MED ORDER — LACTATED RINGERS IV SOLN
INTRAVENOUS | Status: DC
  Administered 2013-05-20 (×2): via INTRAVENOUS

## 2013-05-20 MED ORDER — PRENATAL MULTIVITAMIN CH
1.0000 | ORAL_TABLET | Freq: Every day | ORAL | Status: DC
  Administered 2013-05-20 – 2013-05-21 (×2): 1 via ORAL
  Filled 2013-05-20 (×2): qty 1

## 2013-05-20 MED ORDER — TERBUTALINE SULFATE 1 MG/ML IJ SOLN
0.2500 mg | Freq: Once | INTRAMUSCULAR | Status: DC | PRN
Start: 2013-05-20 — End: 2013-05-20

## 2013-05-20 MED ORDER — PHENYLEPHRINE 40 MCG/ML (10ML) SYRINGE FOR IV PUSH (FOR BLOOD PRESSURE SUPPORT)
80.0000 ug | PREFILLED_SYRINGE | INTRAVENOUS | Status: DC | PRN
  Filled 2013-05-20: qty 2

## 2013-05-20 MED ORDER — IBUPROFEN 600 MG PO TABS: 600.0000 mg | ORAL_TABLET | Freq: Four times a day (QID) | ORAL | Status: DC | PRN

## 2013-05-20 MED ORDER — OXYTOCIN BOLUS FROM INFUSION
500.0000 mL | INTRAVENOUS | Status: DC
  Administered 2013-05-20: 500 mL via INTRAVENOUS

## 2013-05-20 MED ORDER — DIBUCAINE 1 % RE OINT: 1.0000 "application " | TOPICAL_OINTMENT | RECTAL | Status: DC | PRN

## 2013-05-20 MED ORDER — FLEET ENEMA 7-19 GM/118ML RE ENEM: 1.0000 | ENEMA | RECTAL | Status: DC | PRN

## 2013-05-20 MED ORDER — CITRIC ACID-SODIUM CITRATE 334-500 MG/5ML PO SOLN: 30.0000 mL | ORAL | Status: DC | PRN

## 2013-05-20 MED ORDER — LIDOCAINE HCL (PF) 1 % IJ SOLN
30.0000 mL | INTRAMUSCULAR | Status: DC | PRN
  Filled 2013-05-20: qty 30

## 2013-05-20 MED ORDER — IBUPROFEN 600 MG PO TABS
600.0000 mg | ORAL_TABLET | Freq: Four times a day (QID) | ORAL | Status: DC
  Administered 2013-05-20 – 2013-05-22 (×8): 600 mg via ORAL
  Filled 2013-05-20 (×8): qty 1

## 2013-05-20 MED ORDER — WITCH HAZEL-GLYCERIN EX PADS: 1.0000 "application " | MEDICATED_PAD | CUTANEOUS | Status: DC | PRN

## 2013-05-20 MED ORDER — DIPHENHYDRAMINE HCL 25 MG PO CAPS: 25.0000 mg | ORAL_CAPSULE | Freq: Four times a day (QID) | ORAL | Status: DC | PRN

## 2013-05-20 MED ORDER — DIPHENHYDRAMINE HCL 50 MG/ML IJ SOLN: 12.5000 mg | INTRAMUSCULAR | Status: DC | PRN

## 2013-05-20 MED ORDER — TETANUS-DIPHTH-ACELL PERTUSSIS 5-2.5-18.5 LF-MCG/0.5 IM SUSP: 0.5000 mL | Freq: Once | INTRAMUSCULAR | Status: DC

## 2013-05-20 MED ORDER — LIDOCAINE HCL (PF) 1 % IJ SOLN
INTRAMUSCULAR | Status: DC | PRN
  Administered 2013-05-20 (×2): 5 mL

## 2013-05-20 MED ORDER — SIMETHICONE 80 MG PO CHEW: 80.0000 mg | CHEWABLE_TABLET | ORAL | Status: DC | PRN

## 2013-05-20 MED ORDER — LACTATED RINGERS IV SOLN: 500.0000 mL | Freq: Once | INTRAVENOUS | Status: DC

## 2013-05-20 NOTE — Lactation Note (Signed)
This note was copied from the chart of Renee Bethanne GingerYvette Tamplin. Lactation Consultation Note  Patient Name: Renee Melton Renee Melton Date: 05/20/2013 Reason for consult: Initial assessment;Other (Comment) (fussy/stuffy baby; reluctant to latch).  Baby had initial LATCH score of 7 but is too fussy at this attempt to achieve a latch.  He has some dysfunction to his suck during suck exam on gloved finger and his gumline and palate slope sharply, which may be causing him difficulty in feeling moms' nipple on his palate.  LC encouraged STS but she declined and states he will get cold.  LC discussed benefits of STS, with blanket and hat to keep him warm.  Baby born with shoulder dystocia and has some scalp swelling which may cause him to be fussy in certain positions.  Mom has expressible colostrum and is willing to try both cross-cradle and football positions on (L) but baby falls asleep and LC encourages mom and baby take a rest.  LC also cautionsed about how pacifier could be causing baby to become nipple confused and discussed waiting 2 weeks before introducing pacifier or bottle.  Feeding assessment report given to RN, Shanda BumpsJessica.   Maternal Data Infant to breast within first hour of birth: Yes Has patient been taught Hand Expression?: Yes Does the patient have breastfeeding experience prior to this delivery?: Yes  Feeding Feeding Type: Breast Fed Length of feed: 2 min (on and off )  LATCH Score/Interventions Latch: Too sleepy or reluctant, no latch achieved, no sucking elicited. (baby too fussy to latch; nasal stuffiness noted) Intervention(s): Skin to skin;Teach feeding cues;Waking techniques (calming techniques reviewed, as well) Intervention(s): Adjust position;Assist with latch;Breast compression (tried cross-cradle; football)  Audible Swallowing: None (a few drops of colostrum into his mouth during latch attempts) Intervention(s): Skin to skin;Hand expression (mom says it is too cold to undress  baby)  Type of Nipple: Everted at rest and after stimulation  Comfort (Breast/Nipple): Soft / non-tender     Hold (Positioning): Assistance needed to correctly position infant at breast and maintain latch. Intervention(s): Breastfeeding basics reviewed;Support Pillows;Position options;Skin to skin (discussed how shoulder dystocia and head tenderness could cause him to be fussy in certain positions)  LATCH Score: 5  Lactation Tools Discussed/Used   STS, cue feedings, hand expression, calming techniques Positioning Suck training  Consult Status Consult Status: Follow-up tomorrow    Warrick ParisianBryant, Renee Melton 05/20/2013, 6:30 PM

## 2013-05-20 NOTE — Progress Notes (Signed)
Patient ID: Renee Melton, female   DOB: [birth year], 33 y.o.   MRN: [id number] Pt was thought to be 7 cm by the admitting RN and now 2 hours later and comfortable with an epidural the cervix is 6-7 cm 80% effaced and the vertex is at - 2/3 station. AROM produced semi thick meconium stained fluid. Will observe for progress and if no progress will add pitocin.

## 2013-05-20 NOTE — MAU Note (Signed)
Dr. Ambrose MantleHenley notified of pt, orders for admission rec'd.

## 2013-05-20 NOTE — Anesthesia Procedure Notes (Signed)
Epidural Patient location during procedure: OB Start time: 05/20/2013 3:33 AM  Staffing Anesthesiologist: Brayton CavesJACKSON, Eissa Buchberger Performed by: anesthesiologist   Preanesthetic Checklist Completed: patient identified, site marked, surgical consent, pre-op evaluation, timeout performed, IV checked, risks and benefits discussed and monitors and equipment checked  Epidural Patient position: sitting Prep: site prepped and draped and DuraPrep Patient monitoring: continuous pulse ox and blood pressure Approach: midline Injection technique: LOR air  Needle:  Needle type: Tuohy  Needle gauge: 17 G Needle length: 9 cm and 9 Needle insertion depth: 5 cm cm Catheter type: closed end flexible Catheter size: 19 Gauge Catheter at skin depth: 10 cm Test dose: negative  Assessment Events: blood not aspirated, injection not painful, no injection resistance, negative IV test and no paresthesia  Additional Notes Patient identified.  Risk benefits discussed including failed block, incomplete pain control, headache, nerve damage, paralysis, blood pressure changes, nausea, vomiting, reactions to medication both toxic or allergic, and postpartum back pain.  Patient expressed understanding and wished to proceed.  All questions were answered.  Sterile technique used throughout procedure and epidural site dressed with sterile barrier dressing. No paresthesia or other complications noted.The patient did not experience any signs of intravascular injection such as tinnitus or metallic taste in mouth nor signs of intrathecal spread such as rapid motor block. Please see nursing notes for vital signs.

## 2013-05-20 NOTE — H&P (Signed)
NAMBonney Leitz:  Cull, Connelly              ACCOUNT NO.:  [id number]  MEDICAL RECORD NO.:  [id number]  LOCATION:  9168                          FACILITY:  [facility]  PHYSICIAN:  Malachi Prohomas F. Ambrose MantleHenley, M.D. DATE OF BIRTH:  26-Jul-1980  DATE OF ADMISSION:  05/20/2013 DATE OF DISCHARGE:                             HISTORY & PHYSICAL   PRESENT ILLNESS:  This is a 33 year old Hispanic female, para 4-0-0-4, gravida 5, [facility] in [facility].  Blood group and type B negative with negative antibody, rubella immune, RPR nonreactive.  Urine culture negative.  Hepatitis B surface antigen negative, HIV negative, GC and Chlamydia negative.  Group B strep negative.  One hour Glucola 104.  Patient began her prenatal course at approximately 22 weeks' gestation.  Initial ultrasound done on January 06, 2013, was 21 weeks and 1 day, by last period she was 20 weeks and 6 days, due date was selected as May 20, 2013, based on the last menstrual period.  The ultrasound at that time appeared normal.  The patient requested tubal ligation early in pregnancy and was advised to sign her papers.  She was born either a postpartum tubal or an Essure. Essure procedure was discussed with her in detail at 34 weeks.  At her last prenatal visit on May 14, 2013, the cervix was 3 cm, 30%.  She began contracting on the evening of admission, came to the hospital, was thought to be 7 cm dilated and was admitted.  PAST MEDICAL HISTORY:  She has a history of migraines, asthma, and seasonal rhinitis.  SURGERIES:  She states she has had no surgeries.  ALLERGIES:  PENICILLIN caused a rash.  No latex allergy.  FAMILY HISTORY:  The patient states she does have a familial cancer of the breast.  Mother has diabetes mellitus.  Paternal grandmother, cancer of the breast.  SOCIAL HISTORY:  She has never smoked.  Does not drink, 12th grade education.  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  OBSTETRIC HISTORY:   She has had 4 vaginal deliveries, weight ranged from 7 pounds to 8 pounds 10 ounces.  PHYSICAL EXAMINATION:  VITAL SIGNS:  On admission, temperature is 98.3, pulse 93, respirations 20, blood pressure 125/73. HEART:  Normal size and sounds.  No murmurs. LUNGS:  Clear to auscultation. PELVIC:  Fundal height at her last prenatal visit was 40 cm.  Per the RN, on admission, the cervix was 7 cm.  The patient is admitted.  She will receive an epidural if the blood count to be returned in time to get her an epidural.     Malachi Prohomas F. Ambrose MantleHenley, M.D.     TFH/MEDQ  D:  05/20/2013  T:  05/20/2013  Job:  [id number]

## 2013-05-20 NOTE — Anesthesia Preprocedure Evaluation (Signed)
Anesthesia Evaluation  Patient identified by MRN, date of birth, ID band Patient awake    Reviewed: Allergy & Precautions, H&P , Patient's Chart, lab work & pertinent test results  Airway Mallampati: II  TM Distance: >3 FB Neck ROM: full    Dental   Pulmonary asthma ,  breath sounds clear to auscultation        Cardiovascular Rhythm:regular Rate:Normal     Neuro/Psych    GI/Hepatic   Endo/Other    Renal/GU      Musculoskeletal   Abdominal   Peds  Hematology   Anesthesia Other Findings   Reproductive/Obstetrics (+) Pregnancy                             Anesthesia Physical Anesthesia Plan  ASA: II  Anesthesia Plan: Epidural   Post-op Pain Management:    Induction:   Airway Management Planned:   Additional Equipment:   Intra-op Plan:   Post-operative Plan:   Informed Consent: I have reviewed the patients History and Physical, chart, labs and discussed the procedure including the risks, benefits and alternatives for the proposed anesthesia with the patient or authorized representative who has indicated his/her understanding and acceptance.     Plan Discussed with:   Anesthesia Plan Comments:         Anesthesia Quick Evaluation  

## 2013-05-20 NOTE — Anesthesia Postprocedure Evaluation (Signed)
Anesthesia Post Note  Patient: Renee Melton  Procedure(s) Performed: * No procedures listed *  Anesthesia type: Epidural  Patient location: Mother/Baby  Post pain: Pain level controlled  Post assessment: Post-op Vital signs reviewed  Last Vitals:  Filed Vitals:   05/20/13 1452  BP: 104/62  Pulse: 67  Temp: 36.6 C  Resp: 18    Post vital signs: Reviewed  Level of consciousness:alert  Complications: No apparent anesthesia complications

## 2013-05-20 NOTE — MAU Note (Signed)
PT SAYS SHE STARTED HURTING BAD  AT 0030.  VE IN OFFICE LAST WEEK- 3-4  CM.   DENIES HSV AND MRSA.

## 2013-05-20 NOTE — Consult Note (Signed)
Code Apgar paged to Room 168 by Dr. Ellyn HackBovard for shoulder dystocia at [phone].  Delivery team arrived just right after infant was born but was dismissed since infant was stable and crying.   Overton MamMary Ann T Vallory Oetken, MD (Attending Neonatologist)

## 2013-05-20 NOTE — Progress Notes (Signed)
Patient ID: Renee Melton, female   DOB: 1980/09/03, 33 y.o.   MRN: [id number] Pt failed to make progress so pitocin was begun at 1 mu/ minute and has remained there because of frequent contractions. The FHR is normal and the cervix is 8-9 cm 80-90 % effaced and the vertex is at -1  station

## 2013-05-21 LAB — CBC
HCT: 32.3 % — ABNORMAL LOW (ref 36.0–46.0)
Hemoglobin: 10.6 g/dL — ABNORMAL LOW (ref 12.0–15.0)
MCH: 26.6 pg (ref 26.0–34.0)
MCHC: 32.8 g/dL (ref 30.0–36.0)
MCV: 81.2 fL (ref 78.0–100.0)
PLATELETS: 176 10*3/uL (ref 150–400)
RBC: 3.98 MIL/uL (ref 3.87–5.11)
RDW: 15.5 % (ref 11.5–15.5)
WBC: 11.5 10*3/uL — ABNORMAL HIGH (ref 4.0–10.5)

## 2013-05-21 LAB — TYPE AND SCREEN
ABO/RH(D): B NEG
Antibody Screen: NEGATIVE
WEAK D: NEGATIVE

## 2013-05-21 NOTE — Progress Notes (Signed)
Post Partum Day 1 Subjective: no complaints, up ad lib, voiding, tolerating PO and nl lochia, pain controlled  Objective: Blood pressure 109/68, pulse 65, temperature 97.9 F (36.6 C), temperature source Oral, resp. rate 18, height 5\' 4"  (1.626 m), weight 70.761 kg (156 lb), SpO2 98.00%, unknown if currently breastfeeding.  Physical Exam:  General: alert and no distress Lochia: appropriate Uterine Fundus: firm   Recent Labs  05/20/13 0300 05/21/13 0545  HGB 11.5* 10.6*  HCT 33.7* 32.3*    Assessment/Plan: Plan for discharge tomorrow, Breastfeeding and Lactation consult.  Routine care.     LOS: 1 day   BOVARD,Donney Caraveo 05/21/2013, 8:08 AM

## 2013-05-21 NOTE — Lactation Note (Signed)
This note was copied from the chart of Renee Bethanne GingerYvette Frick. Lactation Consultation Note  Patient Name: Renee Melton's Date: 05/21/2013 Reason for consult: Follow-up assessment;Difficult latch Mom reports she is attempting to BF each feeding before giving supplements but baby is not sustaining a latch. Mom reports nipple tenderness from left nipple, some bruising noted. Mom has short nipple shaft, gave Mom hand pump to attempt to pre-pump to help with latch. Baby recently fed so not giving feeding ques at this visit. Care for sore nipples reviewed, comfort gels given with instructions. Discussed with Mom pump and bottle feeding, she will advise. Encouraged Mom to call for assist with latching baby.   Maternal Data    Feeding Feeding Type: Bottle Fed - Formula Nipple Type: Slow - flow  LATCH Score/Interventions       Type of Nipple: Everted at rest and after stimulation  Comfort (Breast/Nipple): Filling, red/small blisters or bruises, mild/mod discomfort  Problem noted: Mild/Moderate discomfort Interventions (Mild/moderate discomfort): Comfort gels;Pre-pump if needed;Hand expression        Lactation Tools Discussed/Used Tools: Pump;Comfort gels Breast pump type: Manual   Consult Status Consult Status: Follow-up Date: 05/22/13 Follow-up type: In-patient    Renee Melton, Renee Melton Ann 05/21/2013, 4:54 PM

## 2013-05-22 MED ORDER — IBUPROFEN 800 MG PO TABS: 800.0000 mg | ORAL_TABLET | Freq: Three times a day (TID) | ORAL | Status: DC | PRN

## 2013-05-22 MED ORDER — OXYCODONE-ACETAMINOPHEN 5-325 MG PO TABS: 1.0000 | ORAL_TABLET | Freq: Four times a day (QID) | ORAL | Status: DC | PRN

## 2013-05-22 MED ORDER — PRENATAL MULTIVITAMIN CH: 1.0000 | ORAL_TABLET | Freq: Every day | ORAL | Status: DC

## 2013-05-22 NOTE — Progress Notes (Signed)
UR chart review completed.  

## 2013-05-22 NOTE — Discharge Summary (Signed)
Obstetric Discharge Summary Reason for Admission: onset of labor Prenatal Procedures: none Intrapartum Procedures: spontaneous vaginal delivery, shoulder dystocia Postpartum Procedures: none Complications-Operative and Postpartum: none Hemoglobin  Date Value Ref Range Status  05/21/2013 10.6* 12.0 - 15.0 g/dL Final     HCT  Date Value Ref Range Status  05/21/2013 32.3* 36.0 - 46.0 % Final    Physical Exam:  General: alert and no distress Lochia: appropriate Uterine Fundus: firm  Discharge Diagnoses: Term Pregnancy-delivered  Discharge Information: Date: 05/22/2013 Activity: pelvic rest Diet: routine Medications: PNV, Ibuprofen and Percocet Condition: stable Instructions: refer to practice specific booklet Discharge to: home Follow-up Information   Follow up with BOVARD,Donte Kary, MD. Schedule an appointment as soon as possible for a visit in 3 weeks. (3-4 weeks - early postpartum check to schedule Essure)    Specialty:  Obstetrics and Gynecology   Contact information:   [address] [place] [address] [phone]       Newborn Data: Live born female  Birth Weight: 10 lb 9.7 oz (4811 g) APGAR: 8, 9  Home with mother.  BOVARD,Allice Garro 05/22/2013, 8:36 AM

## 2013-05-22 NOTE — Progress Notes (Signed)
Post Partum Day 2 Subjective: no complaints, up ad lib, voiding, tolerating PO and nl lochia, pain controlled  Objective: Blood pressure 99/65, pulse 76, temperature 97.7 F (36.5 C), temperature source Oral, resp. rate 18, height 5\' 4"  (1.626 m), weight 70.761 kg (156 lb), SpO2 98.00%, unknown if currently breastfeeding.  Physical Exam:  General: alert and no distress Lochia: appropriate Uterine Fundus: firm    Recent Labs  05/20/13 0300 05/21/13 0545  HGB 11.5* 10.6*  HCT 33.7* 32.3*    Assessment/Plan: Discharge home, Breastfeeding and Lactation consult.  D/c home with motrin, percocet, and pnv.  F/u 6 wks   LOS: 2 days   Melton,Renee Baumgarner 05/22/2013, 8:19 AM

## 2013-10-06 ENCOUNTER — Other Ambulatory Visit (HOSPITAL_COMMUNITY): Payer: Self-pay | Admitting: Obstetrics and Gynecology

## 2013-10-06 DIAGNOSIS — Z308 Encounter for other contraceptive management: Secondary | ICD-10-CM

## 2013-10-13 ENCOUNTER — Ambulatory Visit (HOSPITAL_COMMUNITY): Admission: RE | Admit: 2013-10-13 | Payer: No Typology Code available for payment source | Source: Ambulatory Visit

## 2014-01-25 ENCOUNTER — Encounter (HOSPITAL_COMMUNITY): Payer: Self-pay | Admitting: *Deleted

## 2017-05-01 ENCOUNTER — Emergency Department (HOSPITAL_COMMUNITY): Payer: Self-pay

## 2017-05-01 ENCOUNTER — Encounter (HOSPITAL_COMMUNITY): Payer: Self-pay | Admitting: Emergency Medicine

## 2017-05-01 DIAGNOSIS — Z5321 Procedure and treatment not carried out due to patient leaving prior to being seen by health care provider: Secondary | ICD-10-CM | POA: Insufficient documentation

## 2017-05-01 LAB — BASIC METABOLIC PANEL
ANION GAP: 7 (ref 5–15)
BUN: 10 mg/dL (ref 6–20)
CHLORIDE: 106 mmol/L (ref 101–111)
CO2: 26 mmol/L (ref 22–32)
CREATININE: 0.56 mg/dL (ref 0.44–1.00)
Calcium: 9.5 mg/dL (ref 8.9–10.3)
GFR calc Af Amer: >60 mL/min (ref >60)
GFR calc non Af Amer: >60 mL/min (ref >60)
GLUCOSE: 88 mg/dL (ref 65–99)
Potassium: 3.4 mmol/L — ABNORMAL LOW (ref 3.5–5.1)
Sodium: 139 mmol/L (ref 135–145)

## 2017-05-01 LAB — CBC
HCT: 38.6 % (ref 36.0–46.0)
HEMOGLOBIN: 13.5 g/dL (ref 12.0–15.0)
MCH: 30.7 pg (ref 26.0–34.0)
MCHC: 35 g/dL (ref 30.0–36.0)
MCV: 87.7 fL (ref 78.0–100.0)
Platelets: 245 10*3/uL (ref 150–400)
RBC: 4.4 MIL/uL (ref 3.87–5.11)
RDW: 13.2 % (ref 11.5–15.5)
WBC: 6.6 10*3/uL (ref 4.0–10.5)

## 2017-05-01 LAB — I-STAT TROPONIN, ED: Troponin i, poc: 0 ng/mL (ref 0.00–0.08)

## 2017-05-01 LAB — I-STAT BETA HCG BLOOD, ED (MC, WL, AP ONLY): I-stat hCG, quantitative: <5 m[IU]/mL (ref <5)

## 2017-05-01 NOTE — ED Triage Notes (Signed)
Pt comes in with complaints of generalized chest pains that began before lunch today. Hurts worse with inspiration and spreads to her back.  Pt states after lunch she had some palpitations that were intermittent. Denies smoking. In no acute distress at this time.

## 2017-05-02 ENCOUNTER — Emergency Department (HOSPITAL_COMMUNITY)
Admission: EM | Admit: 2017-05-02 | Discharge: 2017-05-02 | Discharge status: Against Medical Advice | Payer: Self-pay | Attending: Emergency Medicine | Admitting: Emergency Medicine

## 2017-05-02 NOTE — ED Notes (Signed)
Called patient and no answer.

## 2017-05-02 NOTE — ED Notes (Signed)
Called patient to go to a room and no answer 

## 2017-12-30 ENCOUNTER — Ambulatory Visit: Payer: Self-pay | Admitting: Family Medicine

## 2017-12-30 VITALS — BP 118/74 | HR 73 | Temp 98.8°F | Resp 18 | Wt 134.4 lb

## 2017-12-30 DIAGNOSIS — N390 Urinary tract infection, site not specified: Secondary | ICD-10-CM

## 2017-12-30 DIAGNOSIS — Z789 Other specified health status: Secondary | ICD-10-CM

## 2017-12-30 LAB — POCT URINALYSIS DIPSTICK
Bilirubin, UA: NEGATIVE
Blood, UA: NEGATIVE
Glucose, UA: NEGATIVE
Ketones, UA: NEGATIVE
Leukocytes, UA: NEGATIVE
NITRITE UA: NEGATIVE
PROTEIN UA: NEGATIVE
Spec Grav, UA: 1.02 (ref 1.010–1.025)
Urobilinogen, UA: 0.2 E.U./dL
pH, UA: 6 (ref 5.0–8.0)

## 2017-12-30 MED ORDER — NITROFURANTOIN MONOHYD MACRO 100 MG PO CAPS: 100.0000 mg | ORAL_CAPSULE | Freq: Two times a day (BID) | ORAL | 0 refills | Status: AC

## 2017-12-30 MED ORDER — PHENAZOPYRIDINE HCL 200 MG PO TABS: 200.0000 mg | ORAL_TABLET | Freq: Three times a day (TID) | ORAL | 0 refills | Status: AC | PRN

## 2017-12-30 NOTE — Patient Instructions (Signed)
Urinary Tract Infection, Adult A urinary tract infection (UTI) is an infection of any part of the urinary tract. The urinary tract includes the:  Kidneys.  Ureters.  Bladder.  Urethra.  These organs make, store, and get rid of pee (urine) in the body. Follow these instructions at home:  Take over-the-counter and prescription medicines only as told by your doctor.  If you were prescribed an antibiotic medicine, take it as told by your doctor. Do not stop taking the antibiotic even if you start to feel better.  Avoid the following drinks: ? Alcohol. ? Caffeine. ? Tea. ? Carbonated drinks.  Drink enough fluid to keep your pee clear or pale yellow.  Keep all follow-up visits as told by your doctor. This is important.  Make sure to: ? Empty your bladder often and completely. Do not to hold pee for long periods of time. ? Empty your bladder before and after sex. ? Wipe from front to back after a bowel movement if you are female. Use each tissue one time when you wipe. Contact a doctor if:  You have back pain.  You have a fever.  You feel sick to your stomach (nauseous).  You throw up (vomit).  Your symptoms do not get better after 3 days.  Your symptoms go away and then come back. Get help right away if:  You have very bad back pain.  You have very bad lower belly (abdominal) pain.  You are throwing up and cannot keep down any medicines or water. This information is not intended to replace advice given to you by your health care provider. Make sure you discuss any questions you have with your health care provider. Document Released: 08/29/2007 Document Revised: 08/18/2015 Document Reviewed: 01/31/2015 Elsevier Interactive Patient Education  2018 [employer]. Nitrofurantoin tablets or capsules What is this medicine? NITROFURANTOIN (nye troe fyoor AN toyn) is an antibiotic. It is used to treat urinary tract infections. This medicine may be used for other purposes;  ask your health care provider or pharmacist if you have questions. COMMON BRAND NAME(S): Macrobid, Macrodantin, Urotoin What should I tell my health care provider before I take this medicine? They need to know if you have any of these conditions: -anemia -diabetes -glucose-6-phosphate dehydrogenase deficiency -kidney disease -liver disease -lung disease -other chronic illness -an unusual or allergic reaction to nitrofurantoin, other antibiotics, other medicines, foods, dyes or preservatives -pregnant or trying to get pregnant -breast-feeding How should I use this medicine? Take this medicine by mouth with a glass of water. Follow the directions on the prescription label. Take this medicine with food or milk. Take your doses at regular intervals. Do not take your medicine more often than directed. Do not stop taking except on your doctor's advice. Talk to your pediatrician regarding the use of this medicine in children. While this drug may be prescribed for selected conditions, precautions do apply. Overdosage: If you think you have taken too much of this medicine contact a poison control center or emergency room at once. NOTE: This medicine is only for you. Do not share this medicine with others. What if I miss a dose? If you miss a dose, take it as soon as you can. If it is almost time for your next dose, take only that dose. Do not take double or extra doses. What may interact with this medicine? -antacids containing magnesium trisilicate -probenecid -quinolone antibiotics like ciprofloxacin, lomefloxacin, norfloxacin and ofloxacin -sulfinpyrazone This list may not describe all possible interactions. Give your  health care provider a list of all the medicines, herbs, non-prescription drugs, or dietary supplements you use. Also tell them if you smoke, drink alcohol, or use illegal drugs. Some items may interact with your medicine. What should I watch for while using this medicine? Tell  your doctor or health care professional if your symptoms do not improve or if you get new symptoms. Drink several glasses of water a day. If you are taking this medicine for a long time, visit your doctor for regular checks on your progress. If you are diabetic, you may get a false positive result for sugar in your urine with certain brands of urine tests. Check with your doctor. What side effects may I notice from receiving this medicine? Side effects that you should report to your doctor or health care professional as soon as possible: -allergic reactions like skin rash or hives, swelling of the face, lips, or tongue -chest pain -cough -difficulty breathing -dizziness, drowsiness -fever or infection -joint aches or pains -pale or blue-tinted skin -redness, blistering, peeling or loosening of the skin, including inside the mouth -tingling, burning, pain, or numbness in hands or feet -unusual bleeding or bruising -unusually weak or tired -yellowing of eyes or skin Side effects that usually do not require medical attention (report to your doctor or health care professional if they continue or are bothersome): -dark urine -diarrhea -headache -loss of appetite -nausea or vomiting -temporary hair loss This list may not describe all possible side effects. Call your doctor for medical advice about side effects. You may report side effects to FDA at 1-800-FDA-1088. Where should I keep my medicine? Keep out of the reach of children. Store at room temperature between 15 and 30 degrees C (59 and 86 degrees F). Protect from light. Throw away any unused medicine after the expiration date. NOTE: This sheet is a summary. It may not cover all possible information. If you have questions about this medicine, talk to your doctor, pharmacist, or health care provider.  2018 Elsevier/Gold Standard (2007-10-01 15:56:47) Phenazopyridine tablets What is this medicine? PHENAZOPYRIDINE (fen az oh PEER i deen)  is a pain reliever. It is used to stop the pain, burning, or discomfort caused by infection or irritation of the urinary tract. This medicine is not an antibiotic. It will not cure a urinary tract infection. This medicine may be used for other purposes; ask your health care provider or pharmacist if you have questions. COMMON BRAND NAME(S): AZO, Azo-100, Azo-Gesic, Azo-Septic, Azo-Standard, Phenazo, Prodium, Pyridium, Urinary Analgesic, Uristat What should I tell my health care provider before I take this medicine? They need to know if you have any of these conditions: -glucose-6-phosphate dehydrogenase (G6PD) deficiency -kidney disease -an unusual or allergic reaction to phenazopyridine, other medicines, foods, dyes, or preservatives -pregnant or trying to get pregnant -breast-feeding How should I use this medicine? Take this medicine by mouth with a glass of water. Follow the directions on the prescription label. Take after meals. Take your doses at regular intervals. Do not take your medicine more often than directed. Do not skip doses or stop your medicine early even if you feel better. Do not stop taking except on your doctor's advice. Talk to your pediatrician regarding the use of this medicine in children. Special care may be needed. Overdosage: If you think you have taken too much of this medicine contact a poison control center or emergency room at once. NOTE: This medicine is only for you. Do not share this medicine with others. What  if I miss a dose? If you miss a dose, take it as soon as you can. If it is almost time for your next dose, take only that dose. Do not take double or extra doses. What may interact with this medicine? Interactions are not expected. This list may not describe all possible interactions. Give your health care provider a list of all the medicines, herbs, non-prescription drugs, or dietary supplements you use. Also tell them if you smoke, drink alcohol, or use  illegal drugs. Some items may interact with your medicine. What should I watch for while using this medicine? Tell your doctor or health care professional if your symptoms do not improve or if they get worse. This medicine colors body fluids red. This effect is harmless and will go away after you are done taking the medicine. It will change urine to an dark orange or red color. The red color may stain clothing. Soft contact lenses may become permanently stained. It is best not to wear soft contact lenses while taking this medicine. If you are diabetic you may get a false positive result for sugar in your urine. Talk to your health care provider. What side effects may I notice from receiving this medicine? Side effects that you should report to your doctor or health care professional as soon as possible: -allergic reactions like skin rash, itching or hives, swelling of the face, lips, or tongue -blue or purple color of the skin -difficulty breathing -fever -less urine -unusual bleeding, bruising -unusual tired, weak -vomiting -yellowing of the eyes or skin Side effects that usually do not require medical attention (report to your doctor or health care professional if they continue or are bothersome): -dark urine -headache -stomach upset This list may not describe all possible side effects. Call your doctor for medical advice about side effects. You may report side effects to FDA at 1-800-FDA-1088. Where should I keep my medicine? Keep out of the reach of children. Store at room temperature between 15 and 30 degrees C (59 and 86 degrees F). Protect from light and moisture. Throw away any unused medicine after the expiration date. NOTE: This sheet is a summary. It may not cover all possible information. If you have questions about this medicine, talk to your doctor, pharmacist, or health care provider.  2018 Elsevier/Gold Standard (2007-10-09 11:04:07)

## 2017-12-30 NOTE — Progress Notes (Signed)
Renee Melton is a 37 y.o. female who presents today with concerns of the inability to pee and this has persisted since this morning. She reports that she frequently will hold her urine and not go the the bathroom as frequently as she should. She reports the urge and frequently but very little urine when she does actually get to the bathroom. She reports only one other episode in the past.  Review of Systems  Constitutional: Negative for chills, fever and malaise/fatigue.  HENT: Negative for congestion, ear discharge, ear pain, sinus pain and sore throat.   Eyes: Negative.   Respiratory: Negative for cough, sputum production and shortness of breath.   Cardiovascular: Negative.  Negative for chest pain.  Gastrointestinal: Negative for abdominal pain, diarrhea, nausea and vomiting.  Genitourinary: Negative for dysuria, frequency, hematuria and urgency.  Musculoskeletal: Negative for myalgias.  Skin: Negative.   Neurological: Negative for headaches.  Endo/Heme/Allergies: Negative.   Psychiatric/Behavioral: Negative.     O: Vitals:   12/30/17 0907  BP: 118/74  Pulse: 73  Resp: 18  Temp: 98.8 F (37.1 C)  SpO2: 97%     Physical Exam  Constitutional: She is oriented to person, place, and time. Vital signs are normal. She appears well-developed and well-nourished. She is active.  Non-toxic appearance. She does not have a sickly appearance.  HENT:  Head: Normocephalic.  Right Ear: Hearing, tympanic membrane, external ear and ear canal normal.  Left Ear: Hearing, tympanic membrane, external ear and ear canal normal.  Nose: Nose normal.  Mouth/Throat: Uvula is midline and oropharynx is clear and moist.  Neck: Normal range of motion. Neck supple.  Cardiovascular: Normal rate, regular rhythm, normal heart sounds and normal pulses.  Pulmonary/Chest: Effort normal and breath sounds normal.  Abdominal: Soft. Bowel sounds are normal. There is tenderness in the suprapubic area. There is no  rigidity, no rebound, no guarding and no CVA tenderness.  Musculoskeletal: Normal range of motion.  Lymphadenopathy:       Head (right side): No submental and no submandibular adenopathy present.       Head (left side): No submental and no submandibular adenopathy present.    She has no cervical adenopathy.  Neurological: She is alert and oriented to person, place, and time.  Psychiatric: She has a normal mood and affect.  Vitals reviewed.  A: 1. Urinary tract infection without hematuria, site unspecified   2. Discomfort    P: Discussed exam findings, diagnosis etiology and medication use and indications reviewed with patient. Follow- Up and discharge instructions provided. No emergent/urgent issues found on exam.  Patient verbalized understanding of information provided and agrees with plan of care (POC), all questions answered.  1. Urinary tract infection without hematuria, site unspecified Will treat today but discussed the non traditional clinical picture and advised to follow up with PCP- patient UA and exam was unremarkable with symptoms only lasting a few hours. 2. Discomfort - POCT urinalysis dipstick Results for orders placed or performed in visit on 12/30/17 (from the past 24 hour(s))  POCT urinalysis dipstick     Status: Normal   Collection Time: 12/30/17  9:14 AM  Result Value Ref Range   Color, UA Yellow    Clarity, UA Clear    Glucose, UA Negative Negative   Bilirubin, UA Negative    Ketones, UA Negative    Spec Grav, UA 1.020 1.010 - 1.025   Blood, UA Negative    pH, UA 6.0 5.0 - 8.0   Protein, UA Negative  Negative   Urobilinogen, UA 0.2 0.2 or 1.0 E.U./dL   Nitrite, UA Negative    Leukocytes, UA Negative Negative   Appearance     Odor     Other orders - levothyroxine (SYNTHROID, LEVOTHROID) 100 MCG tablet; Take 100 mcg by mouth daily before breakfast. - nitrofurantoin, macrocrystal-monohydrate, (MACROBID) 100 MG capsule; Take 1 capsule (100 mg total) by  mouth 2 (two) times daily for 7 days. - phenazopyridine (PYRIDIUM) 200 MG tablet; Take 1 tablet (200 mg total) by mouth 3 (three) times daily as needed for pain (take with food- may discolor urine).

## 2018-01-01 ENCOUNTER — Telehealth: Payer: Self-pay

## 2018-01-01 NOTE — Telephone Encounter (Signed)
I left a message to the patient asking to call us back. 

## 2022-07-02 ENCOUNTER — Encounter (HOSPITAL_COMMUNITY): Payer: Self-pay

## 2022-07-02 ENCOUNTER — Ambulatory Visit (HOSPITAL_COMMUNITY)
Admission: EM | Admit: 2022-07-02 | Discharge: 2022-07-02 | Disposition: A | Discharge status: Home / Self Care | Payer: Medicaid Other | Attending: Emergency Medicine | Admitting: Emergency Medicine

## 2022-07-02 DIAGNOSIS — L720 Epidermal cyst: Secondary | ICD-10-CM | POA: Diagnosis not present

## 2022-07-02 MED ORDER — CEPHALEXIN 500 MG PO CAPS: 500.0000 mg | ORAL_CAPSULE | Freq: Three times a day (TID) | ORAL | 0 refills | Status: DC

## 2022-07-02 MED ORDER — LIDOCAINE HCL (PF) 1 % IJ SOLN
INTRAMUSCULAR | Status: AC
  Filled 2022-07-02: qty 4

## 2022-07-02 NOTE — ED Provider Notes (Signed)
[facility]    CSN: [id number] Arrival date & time: 07/02/22  1058      History   Chief Complaint Chief Complaint  Patient presents with   Cyst    HPI Renee Melton is a 42 y.o. female.   Patient presents for evaluation of 2 cysts to the center of the chest for 2 days.  Has become red and and painful.  Has attempted use of a heating pad which has been ineffective.  Has had a similar cyst in the same location .  Denies drainage or fever.   Past Medical History:  Diagnosis Date   Asthma    albuterol inhaler 1-2 times a day   History of postpartum hemorrhage, currently pregnant    No pertinent past medical history    Shoulder dystocia, delivered, current hospitalization 05/20/2013   SVD (spontaneous vaginal delivery) 05/20/2013    Patient Active Problem List   Diagnosis Date Noted   Active labor 05/20/2013   SVD (spontaneous vaginal delivery) 05/20/2013   Shoulder dystocia, delivered, current hospitalization 05/20/2013    Past Surgical History:  Procedure Laterality Date   NO PAST SURGERIES      OB History     Gravida  5   Para  5   Term  5   Preterm      AB      Living  5      SAB      IAB      Ectopic      Multiple      Live Births  5            Home Medications    Prior to Admission medications   Medication Sig Start Date End Date Taking? Authorizing Provider  levothyroxine (SYNTHROID, LEVOTHROID) 100 MCG tablet Take 100 mcg by mouth daily before breakfast.   Yes [provider]  albuterol (PROVENTIL HFA;VENTOLIN HFA) 108 (90 BASE) MCG/ACT inhaler Inhale 1 puff into the lungs every 6 (six) hours as needed (asthma).    [provider]  ferrous sulfate 325 (65 FE) MG tablet Take 325 mg by mouth daily.    [provider]  ibuprofen (ADVIL,MOTRIN) 800 MG tablet Take 1 tablet (800 mg total) by mouth every 8 (eight) hours as needed. Patient not taking: Reported on 12/30/2017 05/22/13   Bovard-Stuckert,  Augusto Gamble, MD  oxyCODONE-acetaminophen (PERCOCET/ROXICET) 5-325 MG per tablet Take 1-2 tablets by mouth every 6 (six) hours as needed for severe pain (moderate - severe pain). Patient not taking: Reported on 12/30/2017 05/22/13   Bovard-Stuckert, Augusto Gamble, MD  phenazopyridine (PYRIDIUM) 200 MG tablet Take 1 tablet (200 mg total) by mouth 3 (three) times daily as needed for pain (take with food- may discolor urine). 12/30/17   Zachery Dauer, NP  Prenatal Vit-Fe Fumarate-FA (PRENATAL MULTIVITAMIN) TABS tablet Take 1 tablet by mouth daily at 12 noon. Patient not taking: Reported on 12/30/2017 05/22/13   Sherian Rein, MD    Family History Family History  Problem Relation Age of Onset   Diabetes Mother    Cancer Paternal Grandmother        breast   Anesthesia problems Neg Hx    Hypotension Neg Hx    Malignant hyperthermia Neg Hx    Pseudochol deficiency Neg Hx     Social History Social History   Tobacco Use   Smoking status: Never   Smokeless tobacco: Never  Substance Use Topics   Alcohol use: No   Drug use: No  Allergies   Penicillins   Review of Systems Review of Systems  Constitutional: Negative.   Respiratory: Negative.    Cardiovascular: Negative.   Skin:  Positive for wound. Negative for color change, pallor and rash.     Physical Exam Triage Vital Signs ED Triage Vitals [07/02/22 1239]  Enc Vitals Group     BP (!) 126/90     Pulse Rate 68     Resp 18     Temp 98.6 F (37 C)     Temp Source Oral     SpO2 98 %     Weight      Height      Head Circumference      Peak Flow      Pain Score      Pain Loc      Pain Edu?      Excl. in GC?    No data found.  Updated Vital Signs BP (!) 126/90 (BP Location: Left Arm)   Pulse 68   Temp 98.6 F (37 C) (Oral)   Resp 18   LMP 07/01/2022 (Approximate)   SpO2 98%   Visual Acuity Right Eye Distance:   Left Eye Distance:   Bilateral Distance:    Right Eye Near:   Left Eye Near:    Bilateral Near:      Physical Exam Constitutional:      Appearance: Normal appearance.  HENT:     Head: Normocephalic.  Eyes:     Extraocular Movements: Extraocular movements intact.  Pulmonary:     Effort: Pulmonary effort is normal.  Chest:       Comments: 2 x 3 cm epidermoid cyst present to the center of the chest wall, erythematous and tender, fluctuant Skin:    General: Skin is warm and dry.  Neurological:     Mental Status: She is alert and oriented to person, place, and time.      UC Treatments / Results  Labs (all labs ordered are listed, but only abnormal results are displayed) Labs Reviewed - No data to display  EKG   Radiology No results found.  Procedures Incision and Drainage  Date/Time: 07/02/2022 2:03 PM  Performed by: Valinda HoarWhite, Jaelan Rasheed R, NP Authorized by: Valinda HoarWhite, Saliyah Gillin R, NP   Consent:    Consent obtained:  Verbal   Consent given by:  Patient   Risks, benefits, and alternatives were discussed: yes     Risks discussed:  Incomplete drainage Universal protocol:    Procedure explained and questions answered to patient or proxy's satisfaction: yes     Patient identity confirmed:  Verbally with patient Location:    Type:  Cyst   Size:  2x3c   Location: chest wall. Pre-procedure details:    Skin preparation:  Chlorhexidine with alcohol Anesthesia:    Anesthesia method:  Local infiltration   Local anesthetic:  Lidocaine 1% w/o epi Procedure type:    Complexity:  Simple Procedure details:    Ultrasound guidance: no     Incision types:  Single straight   Drainage:  Purulent (keratin)   Drainage amount:  Copious   Wound treatment:  Wound left open Post-procedure details:    Procedure completion:  Tolerated  (including critical care time)  Medications Ordered in UC Medications - No data to display  Initial Impression / Assessment and Plan / UC Course  I have reviewed the triage vital signs and the nursing notes.  Pertinent labs & imaging results that were  available during my  care of the patient were reviewed by me and considered in my medical decision making (see chart for details).   Epidermoid cyst of the skin of the chest  I&D completed, tolerated well, placed on Keflex, had success with this medicine in the past, recommended Tylenol and Motrin as needed for pain, advised use of heating pads to soften tissue and facilitate drainage, advised to follow-up with general surgery for sac removal Final Clinical Impressions(s) / UC Diagnoses   Final diagnoses:  None   Discharge Instructions   None    ED Prescriptions   None    PDMP not reviewed this encounter.   Valinda Hoar, NP 07/02/22 1404

## 2022-07-02 NOTE — ED Triage Notes (Signed)
Pt reports a cyst in the middle of her chest x 2 days. Denies any drainage or odor.

## 2022-07-02 NOTE — Discharge Instructions (Signed)
Take Keflex every 8 hours for the next 7 days  Hold warm-hot compresses to affected area at least 4 times a day, this helps to facilitate draining, the more the better  Please return for evaluation for increased swelling, increased tenderness or pain, non healing site, non draining site, you begin to have fever or chills   The area has recurred in the same spot twice there is most likely is secondary to skin that will need removal, please follow-up with general surgery, information is listed on front page

## 2022-12-29 ENCOUNTER — Ambulatory Visit
Admission: EM | Admit: 2022-12-29 | Discharge: 2022-12-29 | Disposition: A | Discharge status: Home / Self Care | Payer: Medicaid Other | Attending: Physician Assistant | Admitting: Physician Assistant

## 2022-12-29 DIAGNOSIS — N3000 Acute cystitis without hematuria: Secondary | ICD-10-CM | POA: Diagnosis present

## 2022-12-29 LAB — POCT URINALYSIS DIP (MANUAL ENTRY)
Bilirubin, UA: NEGATIVE
Glucose, UA: NEGATIVE mg/dL
Ketones, POC UA: NEGATIVE mg/dL
Nitrite, UA: NEGATIVE
Protein Ur, POC: NEGATIVE mg/dL
Spec Grav, UA: 1.015 (ref 1.010–1.025)
Urobilinogen, UA: 0.2 U/dL
pH, UA: 7 (ref 5.0–8.0)

## 2022-12-29 MED ORDER — NITROFURANTOIN MONOHYD MACRO 100 MG PO CAPS: 100.0000 mg | ORAL_CAPSULE | Freq: Two times a day (BID) | ORAL | 0 refills | Status: AC

## 2022-12-29 NOTE — ED Triage Notes (Addendum)
Patient presents with symptoms of UTI, last night, urge to urinate with burning after, lower abdomen pain.

## 2022-12-29 NOTE — ED Provider Notes (Signed)
[facility]    CSN: [id number] Arrival date & time: 12/29/22  1322      History   Chief Complaint No chief complaint on file.   HPI Renee Melton is a 42 y.o. female.   Patient here today for evaluation of possible UTI.  She reports that she has had urgency and dysuria as well as some lower abdominal pain.  She notes symptoms started last night.  She denies any nausea or vomiting.  She has not had fever.  The history is provided by the patient.    Past Medical History:  Diagnosis Date   Asthma    albuterol inhaler 1-2 times a day   History of postpartum hemorrhage, currently pregnant    No pertinent past medical history    Shoulder dystocia, delivered, current hospitalization 05/20/2013   SVD (spontaneous vaginal delivery) 05/20/2013    Patient Active Problem List   Diagnosis Date Noted   Active labor 05/20/2013   SVD (spontaneous vaginal delivery) 05/20/2013   Shoulder dystocia, delivered, current hospitalization 05/20/2013    Past Surgical History:  Procedure Laterality Date   NO PAST SURGERIES      OB History     Gravida  5   Para  5   Term  5   Preterm      AB      Living  5      SAB      IAB      Ectopic      Multiple      Live Births  5            Home Medications    Prior to Admission medications   Medication Sig Start Date End Date Taking? Authorizing Provider  nitrofurantoin, macrocrystal-monohydrate, (MACROBID) 100 MG capsule Take 1 capsule (100 mg total) by mouth 2 (two) times daily. 12/29/22  Yes Tomi Bamberger, PA-C  albuterol (PROVENTIL HFA;VENTOLIN HFA) 108 (90 BASE) MCG/ACT inhaler Inhale 1 puff into the lungs every 6 (six) hours as needed (asthma).    [provider]  cephALEXin (KEFLEX) 500 MG capsule Take 1 capsule (500 mg total) by mouth 3 (three) times daily. 07/02/22   Valinda Hoar, NP  ferrous sulfate 325 (65 FE) MG tablet Take 325 mg by mouth daily.    [provider]   ibuprofen (ADVIL,MOTRIN) 800 MG tablet Take 1 tablet (800 mg total) by mouth every 8 (eight) hours as needed. Patient not taking: Reported on 12/30/2017 05/22/13   Bovard-Stuckert, Augusto Gamble, MD  levothyroxine (SYNTHROID, LEVOTHROID) 100 MCG tablet Take 100 mcg by mouth daily before breakfast.    [provider]  oxyCODONE-acetaminophen (PERCOCET/ROXICET) 5-325 MG per tablet Take 1-2 tablets by mouth every 6 (six) hours as needed for severe pain (moderate - severe pain). Patient not taking: Reported on 12/30/2017 05/22/13   Bovard-Stuckert, Augusto Gamble, MD  phenazopyridine (PYRIDIUM) 200 MG tablet Take 1 tablet (200 mg total) by mouth 3 (three) times daily as needed for pain (take with food- may discolor urine). 12/30/17   Zachery Dauer, NP  Prenatal Vit-Fe Fumarate-FA (PRENATAL MULTIVITAMIN) TABS tablet Take 1 tablet by mouth daily at 12 noon. Patient not taking: Reported on 12/30/2017 05/22/13   Sherian Rein, MD    Family History Family History  Problem Relation Age of Onset   Diabetes Mother    Cancer Paternal Grandmother        breast   Anesthesia problems Neg Hx    Hypotension Neg Hx  Malignant hyperthermia Neg Hx    Pseudochol deficiency Neg Hx     Social History Social History   Tobacco Use   Smoking status: Never   Smokeless tobacco: Never  Substance Use Topics   Alcohol use: Yes    Comment: occasionally   Drug use: No     Allergies   Penicillins   Review of Systems Review of Systems  Constitutional:  Negative for chills and fever.  Eyes:  Negative for discharge and redness.  Respiratory:  Negative for shortness of breath.   Gastrointestinal:  Negative for abdominal pain, nausea and vomiting.  Genitourinary:  Positive for dysuria, frequency and urgency.  Musculoskeletal:  Negative for back pain.     Physical Exam Triage Vital Signs ED Triage Vitals  Encounter Vitals Group     BP 12/29/22 1406 123/82     Systolic BP Percentile --      Diastolic BP  Percentile --      Pulse Rate 12/29/22 1406 88     Resp --      Temp 12/29/22 1406 98.3 F (36.8 C)     Temp Source 12/29/22 1406 Oral     SpO2 12/29/22 1406 98 %     Weight 12/29/22 1405 142 lb (64.4 kg)     Height 12/29/22 1405 5\' 3"  (1.6 m)     Head Circumference --      Peak Flow --      Pain Score 12/29/22 1404 4     Pain Loc --      Pain Education --      Exclude from Growth Chart --    No data found.  Updated Vital Signs BP 123/82 (BP Location: Left Arm)   Pulse 88   Temp 98.3 F (36.8 C) (Oral)   Ht 5\' 3"  (1.6 m)   Wt 142 lb (64.4 kg)   LMP 12/19/2022   SpO2 98%   BMI 25.15 kg/m     Physical Exam Vitals and nursing note reviewed.  Constitutional:      General: She is not in acute distress.    Appearance: Normal appearance. She is not ill-appearing.  HENT:     Head: Normocephalic and atraumatic.  Eyes:     Conjunctiva/sclera: Conjunctivae normal.  Cardiovascular:     Rate and Rhythm: Normal rate.  Pulmonary:     Effort: Pulmonary effort is normal. No respiratory distress.  Neurological:     Mental Status: She is alert.  Psychiatric:        Mood and Affect: Mood normal.        Behavior: Behavior normal.        Thought Content: Thought content normal.      UC Treatments / Results  Labs (all labs ordered are listed, but only abnormal results are displayed) Labs Reviewed  POCT URINALYSIS DIP (MANUAL ENTRY) - Abnormal; Notable for the following components:      Result Value   Clarity, UA hazy (*)    Blood, UA small (*)    Leukocytes, UA Small (1+) (*)    All other components within normal limits  URINE CULTURE    EKG   Radiology No results found.  Procedures Procedures (including critical care time)  Medications Ordered in UC Medications - No data to display  Initial Impression / Assessment and Plan / UC Course  I have reviewed the triage vital signs and the nursing notes.  Pertinent labs & imaging results that were available during  my care  of the patient were reviewed by me and considered in my medical decision making (see chart for details).    COVID prescribed for suspected UTI.  Recommended follow-up if no gradual improvement or with any further concerns.  Urine culture ordered.  Final Clinical Impressions(s) / UC Diagnoses   Final diagnoses:  Acute cystitis without hematuria   Discharge Instructions   None    ED Prescriptions     Medication Sig Dispense Auth. Provider   nitrofurantoin, macrocrystal-monohydrate, (MACROBID) 100 MG capsule Take 1 capsule (100 mg total) by mouth 2 (two) times daily. 10 capsule Tomi Bamberger, PA-C      PDMP not reviewed this encounter.   Tomi Bamberger, PA-C 12/29/22 1454

## 2022-12-30 LAB — URINE CULTURE: Culture: NO GROWTH

## 2023-05-30 ENCOUNTER — Ambulatory Visit
Admission: RE | Admit: 2023-05-30 | Discharge: 2023-05-30 | Disposition: A | Discharge status: Home / Self Care | Source: Ambulatory Visit | Attending: Family Medicine | Admitting: Family Medicine

## 2023-05-30 VITALS — BP 135/95 | HR 91 | Temp 97.9°F | Resp 19 | Ht 63.0 in | Wt 138.0 lb

## 2023-05-30 DIAGNOSIS — N760 Acute vaginitis: Secondary | ICD-10-CM | POA: Diagnosis not present

## 2023-05-30 LAB — POCT URINALYSIS DIP (MANUAL ENTRY)
Bilirubin, UA: NEGATIVE
Blood, UA: NEGATIVE
Glucose, UA: NEGATIVE mg/dL
Ketones, POC UA: NEGATIVE mg/dL
Nitrite, UA: NEGATIVE
Protein Ur, POC: NEGATIVE mg/dL
Spec Grav, UA: <=1.005 — AB (ref 1.010–1.025)
Urobilinogen, UA: 0.2 U/dL
pH, UA: 5.5 (ref 5.0–8.0)

## 2023-05-30 MED ORDER — FLUCONAZOLE 150 MG PO TABS: 150.0000 mg | ORAL_TABLET | ORAL | 0 refills | Status: DC

## 2023-05-30 NOTE — ED Provider Notes (Signed)
 [facility]  Note:  This document was prepared using Conservation officer, historic buildings and may include unintentional dictation errors.  MRN: [id number] DOB: [birth year]  Subjective:   Renee Melton is a 43 y.o. female presenting for 4-day history of persistent vaginal irritation, redness over the left vaginal area.  Denies urinary symptoms.  No discharge.  Would like to be screened for sexually transmitted infection.  No current facility-administered medications for this encounter.  Current Outpatient Medications:    albuterol (PROVENTIL HFA;VENTOLIN HFA) 108 (90 BASE) MCG/ACT inhaler, Inhale 1 puff into the lungs every 6 (six) hours as needed (asthma)., Disp: , Rfl:    cephALEXin (KEFLEX) 500 MG capsule, Take 1 capsule (500 mg total) by mouth 3 (three) times daily., Disp: 20 capsule, Rfl: 0   ferrous sulfate 325 (65 FE) MG tablet, Take 325 mg by mouth daily., Disp: , Rfl:    ibuprofen (ADVIL,MOTRIN) 800 MG tablet, Take 1 tablet (800 mg total) by mouth every 8 (eight) hours as needed. (Patient not taking: Reported on 12/30/2017), Disp: 45 tablet, Rfl: 1   levothyroxine (SYNTHROID, LEVOTHROID) 100 MCG tablet, Take 100 mcg by mouth daily before breakfast., Disp: , Rfl:    nitrofurantoin, macrocrystal-monohydrate, (MACROBID) 100 MG capsule, Take 1 capsule (100 mg total) by mouth 2 (two) times daily., Disp: 10 capsule, Rfl: 0   oxyCODONE-acetaminophen (PERCOCET/ROXICET) 5-325 MG per tablet, Take 1-2 tablets by mouth every 6 (six) hours as needed for severe pain (moderate - severe pain). (Patient not taking: Reported on 12/30/2017), Disp: 15 tablet, Rfl: 0   phenazopyridine (PYRIDIUM) 200 MG tablet, Take 1 tablet (200 mg total) by mouth 3 (three) times daily as needed for pain (take with food- may discolor urine)., Disp: 6 tablet, Rfl: 0   Prenatal Vit-Fe Fumarate-FA (PRENATAL MULTIVITAMIN) TABS tablet, Take 1 tablet by mouth daily at 12 noon. (Patient not taking: Reported on  12/30/2017), Disp: 30 tablet, Rfl: 12   Allergies  Allergen Reactions   Penicillins Rash    Past Medical History:  Diagnosis Date   Asthma    albuterol inhaler 1-2 times a day   History of postpartum hemorrhage, currently pregnant    No pertinent past medical history    Shoulder dystocia, delivered, current hospitalization 05/20/2013   SVD (spontaneous vaginal delivery) 05/20/2013     Past Surgical History:  Procedure Laterality Date   NO PAST SURGERIES      Family History  Problem Relation Age of Onset   Diabetes Mother    Cancer Paternal Grandmother        breast   Anesthesia problems Neg Hx    Hypotension Neg Hx    Malignant hyperthermia Neg Hx    Pseudochol deficiency Neg Hx     Social History   Tobacco Use   Smoking status: Never   Smokeless tobacco: Never  Substance Use Topics   Alcohol use: Yes    Comment: occasionally   Drug use: No    ROS   Objective:   Vitals: BP (!) 135/95 (BP Location: Left Arm)   Pulse 91   Temp 97.9 F (36.6 C) (Oral)   Resp 19   Ht 5\' 3"  (1.6 m)   Wt 138 lb (62.6 kg)   LMP 05/02/2023   SpO2 99%   BMI 24.45 kg/m   Physical Exam Constitutional:      General: She is not in acute distress.    Appearance: Normal appearance. She is well-developed. She is not ill-appearing, toxic-appearing or  diaphoretic.  HENT:     Head: Normocephalic and atraumatic.     Nose: Nose normal.     Mouth/Throat:     Mouth: Mucous membranes are moist.  Eyes:     General: No scleral icterus.       Right eye: No discharge.        Left eye: No discharge.     Extraocular Movements: Extraocular movements intact.  Cardiovascular:     Rate and Rhythm: Normal rate.  Pulmonary:     Effort: Pulmonary effort is normal.  Genitourinary:   Skin:    General: Skin is warm and dry.  Neurological:     General: No focal deficit present.     Mental Status: She is alert and oriented to person, place, and time.  Psychiatric:        Mood and Affect:  Mood normal.        Behavior: Behavior normal.      Assessment and Plan :   PDMP not reviewed this encounter.  1. Acute vaginitis    Will treat patient empirically for an acute vaginitis with Diflucan.  Labs pending.  Counseled patient on potential for adverse effects with medications prescribed/recommended today, ER and return-to-clinic precautions discussed, patient verbalized understanding.    Wallis Bamberg, PA-C 05/30/23 1329

## 2023-05-30 NOTE — ED Triage Notes (Signed)
 Pt states that she has some burning of her vagina. Pt states that she doesn't have any burning with urination. X4 days  Pt states that the burning is on the outside of her vagina on both sides.

## 2023-05-31 ENCOUNTER — Ambulatory Visit (HOSPITAL_COMMUNITY): Payer: Self-pay

## 2023-05-31 LAB — CERVICOVAGINAL ANCILLARY ONLY
Chlamydia: NEGATIVE
Comment: NEGATIVE
Comment: NEGATIVE
Comment: NORMAL
Neisseria Gonorrhea: NEGATIVE
Trichomonas: NEGATIVE

## 2023-06-02 LAB — HSV CULTURE AND TYPING

## 2023-09-20 ENCOUNTER — Telehealth

## 2023-09-20 DIAGNOSIS — N760 Acute vaginitis: Secondary | ICD-10-CM

## 2023-09-21 MED ORDER — METRONIDAZOLE 500 MG PO TABS
500.0000 mg | ORAL_TABLET | Freq: Two times a day (BID) | ORAL | 0 refills | Status: AC
Start: 2023-09-21 — End: 2023-09-28

## 2023-09-21 MED ORDER — FLUCONAZOLE 150 MG PO TABS: 150.0000 mg | ORAL_TABLET | Freq: Once | ORAL | 0 refills | Status: AC

## 2023-09-21 NOTE — Progress Notes (Signed)
 I have spent 5 minutes in review of e-visit questionnaire, review and updating patient chart, medical decision making and response to patient.   Claiborne Rigg, NP

## 2023-09-21 NOTE — Progress Notes (Signed)
 E-Visit for Vaginal Symptoms  We are sorry that you are not feeling well. Here is how we plan to help! Based on what you shared with me it looks like you: May have a vaginosis due to bacteria  Vaginosis is an inflammation of the vagina that can result in discharge, itching and pain. The cause is usually a change in the normal balance of vaginal bacteria or an infection. Vaginosis can also result from reduced estrogen levels after menopause.  The most common causes of vaginosis are:   Bacterial vaginosis which results from an overgrowth of one on several organisms that are normally present in your vagina.   Yeast infections which are caused by a naturally occurring fungus called candida.   Vaginal atrophy (atrophic vaginosis) which results from the thinning of the vagina from reduced estrogen levels after menopause.   Trichomoniasis which is caused by a parasite and is commonly transmitted by sexual intercourse.  Factors that increase your risk of developing vaginosis include: Medications, such as antibiotics and steroids Uncontrolled diabetes Use of hygiene products such as bubble bath, vaginal spray or vaginal deodorant Douching Wearing damp or tight-fitting clothing Using an intrauterine device (IUD) for birth control Hormonal changes, such as those associated with pregnancy, birth control pills or menopause Sexual activity Having a sexually transmitted infection  Your treatment plan is Metronidazole or Flagyl 500mg  twice a day for 7 days.  I have electronically sent this prescription into the pharmacy that you have chosen. Yeast infections do not usually present with a foul odor so I have sent metronidazole instead for bacterial vaginosis. If you are not feeling better after completion  in 7 days you can take the diflucan  that was also sent.   Be sure to take all of the medication as directed. Stop taking any medication if you develop a rash, tongue swelling or shortness of breath.  Mothers who are breast feeding should consider pumping and discarding their breast milk while on these antibiotics. However, there is no consensus that infant exposure at these doses would be harmful.  Remember that medication creams can weaken latex condoms. SABRA   HOME CARE:  Good hygiene may prevent some types of vaginosis from recurring and may relieve some symptoms:  Avoid baths, hot tubs and whirlpool spas. Rinse soap from your outer genital area after a shower, and dry the area well to prevent irritation. Don't use scented or harsh soaps, such as those with deodorant or antibacterial action. Avoid irritants. These include scented tampons and pads. Wipe from front to back after using the toilet. Doing so avoids spreading fecal bacteria to your vagina.  Other things that may help prevent vaginosis include:  Don't douche. Your vagina doesn't require cleansing other than normal bathing. Repetitive douching disrupts the normal organisms that reside in the vagina and can actually increase your risk of vaginal infection. Douching won't clear up a vaginal infection. Use a latex condom. Both female and female latex condoms may help you avoid infections spread by sexual contact. Wear cotton underwear. Also wear pantyhose with a cotton crotch. If you feel comfortable without it, skip wearing underwear to bed. Yeast thrives in [employer] Your symptoms should improve in the next day or two.  GET HELP RIGHT AWAY IF:  You have pain in your lower abdomen ( pelvic area or over your ovaries) You develop nausea or vomiting You develop a fever Your discharge changes or worsens You have persistent pain with intercourse You develop shortness of breath, a rapid  pulse, or you faint.  These symptoms could be signs of problems or infections that need to be evaluated by a medical provider now.  MAKE SURE YOU   Understand these instructions. Will watch your condition. Will get help right away if you  are not doing well or get worse.  Thank you for choosing an e-visit.  Your e-visit answers were reviewed by a board certified advanced clinical practitioner to complete your personal care plan. Depending upon the condition, your plan could have included both over the counter or prescription medications.  Please review your pharmacy choice. Make sure the pharmacy is open so you can pick up prescription now. If there is a problem, you may contact your provider through [employer] and have the prescription routed to another pharmacy.  Your safety is important to us . If you have drug allergies check your prescription carefully.   For the next 24 hours you can use MyChart to ask questions about today's visit, request a non-urgent call back, or ask for a work or school excuse. You will get an email in the next two days asking about your experience. I hope that your e-visit has been valuable and will speed your recovery.

## 2023-10-07 ENCOUNTER — Telehealth: Admitting: Physician Assistant

## 2023-10-07 DIAGNOSIS — N760 Acute vaginitis: Secondary | ICD-10-CM

## 2023-10-08 NOTE — Progress Notes (Signed)
  Because of recurring symptoms after treatment for bv/yeast, I feel your condition warrants further evaluation and I recommend that you be seen in a face-to-face visit.   NOTE: There will be NO CHARGE for this E-Visit   If you are having a true medical emergency, please call 911.     For an urgent face to face visit, [place] has multiple urgent care centers for your convenience.  Click the link below for the full list of locations and hours, walk-in wait times, appointment scheduling options and driving directions:  Urgent Care - [place], [place], [place], [place], [place], [place]  [place]     Your MyChart E-visit questionnaire answers were reviewed by a board certified advanced clinical practitioner to complete your personal care plan based on your specific symptoms.    Thank you for using e-Visits.
